# Patient Record
Sex: Female | Born: 1935 | Hispanic: No | Marital: Married
Health system: Southern US, Community
[De-identification: ages and names within clinical notes are randomized; demographics above are authoritative.]

## PROBLEM LIST (undated history)

## (undated) DIAGNOSIS — I10 Essential (primary) hypertension: Secondary | ICD-10-CM

## (undated) DIAGNOSIS — H9190 Unspecified hearing loss, unspecified ear: Secondary | ICD-10-CM

## (undated) DIAGNOSIS — R600 Localized edema: Secondary | ICD-10-CM

## (undated) DIAGNOSIS — E039 Hypothyroidism, unspecified: Secondary | ICD-10-CM

## (undated) DIAGNOSIS — Z950 Presence of cardiac pacemaker: Secondary | ICD-10-CM

## (undated) DIAGNOSIS — I482 Chronic atrial fibrillation, unspecified: Secondary | ICD-10-CM

## (undated) DIAGNOSIS — I442 Atrioventricular block, complete: Secondary | ICD-10-CM

## (undated) DIAGNOSIS — E785 Hyperlipidemia, unspecified: Secondary | ICD-10-CM

## (undated) DIAGNOSIS — N189 Chronic kidney disease, unspecified: Secondary | ICD-10-CM

## (undated) DIAGNOSIS — I739 Peripheral vascular disease, unspecified: Secondary | ICD-10-CM

## (undated) DIAGNOSIS — G4733 Obstructive sleep apnea (adult) (pediatric): Secondary | ICD-10-CM

## (undated) HISTORY — PX: THYROIDECTOMY: SHX17

---

## 2004-08-24 ENCOUNTER — Ambulatory Visit: Payer: Self-pay

## 2004-09-30 ENCOUNTER — Ambulatory Visit: Payer: Self-pay | Admitting: Internal Medicine

## 2004-11-09 ENCOUNTER — Ambulatory Visit: Payer: Self-pay | Admitting: Internal Medicine

## 2004-12-28 ENCOUNTER — Emergency Department: Payer: Self-pay | Admitting: Internal Medicine

## 2004-12-28 ENCOUNTER — Other Ambulatory Visit: Payer: Self-pay

## 2005-04-24 ENCOUNTER — Emergency Department: Payer: Self-pay | Admitting: Emergency Medicine

## 2005-04-28 ENCOUNTER — Other Ambulatory Visit: Payer: Self-pay

## 2005-04-28 ENCOUNTER — Inpatient Hospital Stay: Payer: Self-pay | Admitting: Internal Medicine

## 2005-05-06 ENCOUNTER — Encounter: Payer: Self-pay | Admitting: Internal Medicine

## 2005-05-20 ENCOUNTER — Encounter: Payer: Self-pay | Admitting: Internal Medicine

## 2005-05-25 ENCOUNTER — Ambulatory Visit: Payer: Self-pay

## 2005-06-20 ENCOUNTER — Encounter: Payer: Self-pay | Admitting: Internal Medicine

## 2005-07-20 ENCOUNTER — Encounter: Payer: Self-pay | Admitting: Internal Medicine

## 2005-08-20 ENCOUNTER — Encounter: Payer: Self-pay | Admitting: Internal Medicine

## 2005-09-19 ENCOUNTER — Encounter: Payer: Self-pay | Admitting: Internal Medicine

## 2005-10-20 ENCOUNTER — Encounter: Payer: Self-pay | Admitting: Internal Medicine

## 2005-11-20 ENCOUNTER — Encounter: Payer: Self-pay | Admitting: Internal Medicine

## 2005-12-20 ENCOUNTER — Encounter: Payer: Self-pay | Admitting: Internal Medicine

## 2006-01-20 ENCOUNTER — Encounter: Payer: Self-pay | Admitting: Internal Medicine

## 2006-05-02 ENCOUNTER — Emergency Department: Payer: Self-pay | Admitting: Internal Medicine

## 2006-06-22 ENCOUNTER — Emergency Department: Payer: Self-pay | Admitting: Emergency Medicine

## 2006-06-22 ENCOUNTER — Other Ambulatory Visit: Payer: Self-pay

## 2007-05-03 ENCOUNTER — Ambulatory Visit: Payer: Self-pay | Admitting: Internal Medicine

## 2007-09-26 ENCOUNTER — Ambulatory Visit: Payer: Self-pay | Admitting: Gastroenterology

## 2007-10-06 ENCOUNTER — Ambulatory Visit: Payer: Self-pay | Admitting: Unknown Physician Specialty

## 2007-11-01 ENCOUNTER — Ambulatory Visit: Payer: Self-pay | Admitting: Unknown Physician Specialty

## 2008-07-12 ENCOUNTER — Inpatient Hospital Stay: Payer: Self-pay | Admitting: Internal Medicine

## 2008-10-30 ENCOUNTER — Ambulatory Visit: Payer: Self-pay | Admitting: Internal Medicine

## 2012-12-08 ENCOUNTER — Ambulatory Visit: Payer: Self-pay | Admitting: Gastroenterology

## 2012-12-25 ENCOUNTER — Ambulatory Visit: Payer: Self-pay | Admitting: Gastroenterology

## 2013-04-26 ENCOUNTER — Ambulatory Visit: Payer: Self-pay | Admitting: Cardiology

## 2013-04-26 LAB — BASIC METABOLIC PANEL
ANION GAP: 1 — AB (ref 7–16)
BUN: 14 mg/dL (ref 7–18)
CALCIUM: 9.2 mg/dL (ref 8.5–10.1)
Chloride: 102 mmol/L (ref 98–107)
Co2: 32 mmol/L (ref 21–32)
Creatinine: 1.4 mg/dL — ABNORMAL HIGH (ref 0.60–1.30)
GFR CALC AF AMER: 37 — AB
GFR CALC NON AF AMER: 32 — AB
Glucose: 107 mg/dL — ABNORMAL HIGH (ref 65–99)
Osmolality: 271 (ref 275–301)
Potassium: 4.4 mmol/L (ref 3.5–5.1)
Sodium: 135 mmol/L — ABNORMAL LOW (ref 136–145)

## 2013-04-26 LAB — CBC WITH DIFFERENTIAL/PLATELET
BASOS PCT: 0.2 %
Basophil #: 0 10*3/uL (ref 0.0–0.1)
Eosinophil #: 0 10*3/uL (ref 0.0–0.7)
Eosinophil %: 0.4 %
HCT: 38.7 % (ref 35.0–47.0)
HGB: 13.2 g/dL (ref 12.0–16.0)
LYMPHS ABS: 1.3 10*3/uL (ref 1.0–3.6)
Lymphocyte %: 15.4 %
MCH: 31.5 pg (ref 26.0–34.0)
MCHC: 34.2 g/dL (ref 32.0–36.0)
MCV: 92 fL (ref 80–100)
Monocyte #: 0.6 x10 3/mm (ref 0.2–0.9)
Monocyte %: 7.4 %
Neutrophil #: 6.3 10*3/uL (ref 1.4–6.5)
Neutrophil %: 76.6 %
Platelet: 210 10*3/uL (ref 150–440)
RBC: 4.2 10*6/uL (ref 3.80–5.20)
RDW: 14.4 % (ref 11.5–14.5)
WBC: 8.2 10*3/uL (ref 3.6–11.0)

## 2013-04-26 LAB — PROTIME-INR
INR: 1
Prothrombin Time: 13.4 secs (ref 11.5–14.7)

## 2013-04-26 LAB — APTT: ACTIVATED PTT: 25.9 s (ref 23.6–35.9)

## 2013-05-02 ENCOUNTER — Ambulatory Visit: Payer: Self-pay | Admitting: Cardiology

## 2014-04-20 DIAGNOSIS — J449 Chronic obstructive pulmonary disease, unspecified: Secondary | ICD-10-CM | POA: Diagnosis not present

## 2014-04-25 DIAGNOSIS — G4733 Obstructive sleep apnea (adult) (pediatric): Secondary | ICD-10-CM | POA: Diagnosis not present

## 2014-04-25 DIAGNOSIS — I4891 Unspecified atrial fibrillation: Secondary | ICD-10-CM | POA: Diagnosis not present

## 2014-04-25 DIAGNOSIS — R600 Localized edema: Secondary | ICD-10-CM | POA: Diagnosis not present

## 2014-04-25 DIAGNOSIS — I442 Atrioventricular block, complete: Secondary | ICD-10-CM | POA: Diagnosis not present

## 2014-04-25 DIAGNOSIS — I482 Chronic atrial fibrillation: Secondary | ICD-10-CM | POA: Diagnosis not present

## 2014-05-20 DIAGNOSIS — J449 Chronic obstructive pulmonary disease, unspecified: Secondary | ICD-10-CM | POA: Diagnosis not present

## 2014-06-19 DIAGNOSIS — J449 Chronic obstructive pulmonary disease, unspecified: Secondary | ICD-10-CM | POA: Diagnosis not present

## 2014-07-13 NOTE — Op Note (Signed)
PATIENT NAME:  Victoria Colon, Victoria Colon MR#:  [id number] DATE OF BIRTH:  [birth year]  DATE OF PROCEDURE:  05/02/2013  PRIMARY CARE PHYSICIAN: Dr. Marcello FennelHande.  PREPROCEDURAL DIAGNOSES:  1. Complete heart block pacemaker. 2. Pacemaker and elective replacement indication.   PROCEDURE: Single-chamber pacemaker generator change out.   POSTPROCEDURE DIAGNOSIS: Ventricular pacing.   INDICATION: The patient is a 81 year old female who is status post dual chamber pacemaker for complete heart block. Recent pacemaker interrogation has shown the pacemaker was at elective replacement indication. The procedure, risks, benefits, and alternatives of pacemaker generator change out were explained to the patient and informed written consent was obtained.   DESCRIPTION OF PROCEDURE: She was brought to the Operating Room in a fasting state. The right pectoral region was prepped and draped in the usual sterile manner. Anesthesia was obtained with 1% Xylocaine locally. A 6 cm incision was performed over the old pacemaker generator site. The old pacemaker generator was retrieved by electrocautery and blunt dissection. The old pacemaker generator was disconnected from the leads. The atrial lead was capped and sutured. The ventricular lead was connected to a new single-chamber rate responsive pacemaker generator (Medtronic Adapta [id number]). The pacemaker pocket was irrigated with gentamicin solution. The new pacemaker generator was positioned in the pocket. The pocket was closed with 2 - 0 and 4-0 Vicryl, respectively. Steri-Strips and pressure dressing were applied    ____________________________ Marcina MillardAlexander Lockie Bothun, MD ap:sg D: 05/02/2013 13:15:00 ET T: 05/02/2013 13:23:50 ET JOB#: [id number]  cc: Marcina MillardAlexander Shaune Malacara, MD, <Dictator> Marcina MillardALEXANDER Cortland Crehan MD ELECTRONICALLY SIGNED 05/25/2013 13:14

## 2014-07-20 DIAGNOSIS — J449 Chronic obstructive pulmonary disease, unspecified: Secondary | ICD-10-CM | POA: Diagnosis not present

## 2014-07-23 DIAGNOSIS — I10 Essential (primary) hypertension: Secondary | ICD-10-CM | POA: Diagnosis not present

## 2014-07-23 DIAGNOSIS — I739 Peripheral vascular disease, unspecified: Secondary | ICD-10-CM | POA: Diagnosis not present

## 2014-07-23 DIAGNOSIS — I482 Chronic atrial fibrillation: Secondary | ICD-10-CM | POA: Diagnosis not present

## 2014-07-23 DIAGNOSIS — I442 Atrioventricular block, complete: Secondary | ICD-10-CM | POA: Diagnosis not present

## 2014-08-06 DIAGNOSIS — H6121 Impacted cerumen, right ear: Secondary | ICD-10-CM | POA: Diagnosis not present

## 2014-08-06 DIAGNOSIS — H903 Sensorineural hearing loss, bilateral: Secondary | ICD-10-CM | POA: Diagnosis not present

## 2014-08-06 DIAGNOSIS — H6123 Impacted cerumen, bilateral: Secondary | ICD-10-CM | POA: Diagnosis not present

## 2014-08-19 DIAGNOSIS — J449 Chronic obstructive pulmonary disease, unspecified: Secondary | ICD-10-CM | POA: Diagnosis not present

## 2014-09-19 DIAGNOSIS — J449 Chronic obstructive pulmonary disease, unspecified: Secondary | ICD-10-CM | POA: Diagnosis not present

## 2014-10-19 DIAGNOSIS — J449 Chronic obstructive pulmonary disease, unspecified: Secondary | ICD-10-CM | POA: Diagnosis not present

## 2014-10-22 DIAGNOSIS — I10 Essential (primary) hypertension: Secondary | ICD-10-CM | POA: Diagnosis not present

## 2014-10-22 DIAGNOSIS — I482 Chronic atrial fibrillation: Secondary | ICD-10-CM | POA: Diagnosis not present

## 2014-10-22 DIAGNOSIS — R600 Localized edema: Secondary | ICD-10-CM | POA: Diagnosis not present

## 2014-10-22 DIAGNOSIS — I442 Atrioventricular block, complete: Secondary | ICD-10-CM | POA: Diagnosis not present

## 2014-11-19 DIAGNOSIS — J449 Chronic obstructive pulmonary disease, unspecified: Secondary | ICD-10-CM | POA: Diagnosis not present

## 2014-11-28 DIAGNOSIS — I482 Chronic atrial fibrillation: Secondary | ICD-10-CM | POA: Diagnosis not present

## 2014-11-28 DIAGNOSIS — Z950 Presence of cardiac pacemaker: Secondary | ICD-10-CM | POA: Diagnosis not present

## 2014-11-28 DIAGNOSIS — I10 Essential (primary) hypertension: Secondary | ICD-10-CM | POA: Diagnosis not present

## 2014-11-28 DIAGNOSIS — G4733 Obstructive sleep apnea (adult) (pediatric): Secondary | ICD-10-CM | POA: Diagnosis not present

## 2014-12-20 DIAGNOSIS — J449 Chronic obstructive pulmonary disease, unspecified: Secondary | ICD-10-CM | POA: Diagnosis not present

## 2015-01-19 DIAGNOSIS — J449 Chronic obstructive pulmonary disease, unspecified: Secondary | ICD-10-CM | POA: Diagnosis not present

## 2015-02-19 DIAGNOSIS — J449 Chronic obstructive pulmonary disease, unspecified: Secondary | ICD-10-CM | POA: Diagnosis not present

## 2015-03-21 DIAGNOSIS — J449 Chronic obstructive pulmonary disease, unspecified: Secondary | ICD-10-CM | POA: Diagnosis not present

## 2015-04-21 DIAGNOSIS — J449 Chronic obstructive pulmonary disease, unspecified: Secondary | ICD-10-CM | POA: Diagnosis not present

## 2015-04-24 DIAGNOSIS — I442 Atrioventricular block, complete: Secondary | ICD-10-CM | POA: Diagnosis not present

## 2015-04-24 DIAGNOSIS — I739 Peripheral vascular disease, unspecified: Secondary | ICD-10-CM | POA: Diagnosis not present

## 2015-04-24 DIAGNOSIS — I482 Chronic atrial fibrillation: Secondary | ICD-10-CM | POA: Diagnosis not present

## 2015-04-24 DIAGNOSIS — E782 Mixed hyperlipidemia: Secondary | ICD-10-CM | POA: Diagnosis not present

## 2015-05-20 DIAGNOSIS — J449 Chronic obstructive pulmonary disease, unspecified: Secondary | ICD-10-CM | POA: Diagnosis not present

## 2015-05-22 DIAGNOSIS — Z950 Presence of cardiac pacemaker: Secondary | ICD-10-CM | POA: Diagnosis not present

## 2015-05-22 DIAGNOSIS — I482 Chronic atrial fibrillation: Secondary | ICD-10-CM | POA: Diagnosis not present

## 2015-05-22 DIAGNOSIS — H9193 Unspecified hearing loss, bilateral: Secondary | ICD-10-CM | POA: Diagnosis not present

## 2015-05-22 DIAGNOSIS — G4733 Obstructive sleep apnea (adult) (pediatric): Secondary | ICD-10-CM | POA: Diagnosis not present

## 2015-05-22 DIAGNOSIS — I10 Essential (primary) hypertension: Secondary | ICD-10-CM | POA: Diagnosis not present

## 2015-05-30 DIAGNOSIS — D638 Anemia in other chronic diseases classified elsewhere: Secondary | ICD-10-CM | POA: Diagnosis not present

## 2015-05-30 DIAGNOSIS — N183 Chronic kidney disease, stage 3 (moderate): Secondary | ICD-10-CM | POA: Diagnosis not present

## 2015-05-30 DIAGNOSIS — E039 Hypothyroidism, unspecified: Secondary | ICD-10-CM | POA: Diagnosis not present

## 2015-05-30 DIAGNOSIS — E782 Mixed hyperlipidemia: Secondary | ICD-10-CM | POA: Diagnosis not present

## 2015-05-30 DIAGNOSIS — I10 Essential (primary) hypertension: Secondary | ICD-10-CM | POA: Diagnosis not present

## 2015-05-30 DIAGNOSIS — Z0000 Encounter for general adult medical examination without abnormal findings: Secondary | ICD-10-CM | POA: Diagnosis not present

## 2015-05-30 DIAGNOSIS — I482 Chronic atrial fibrillation: Secondary | ICD-10-CM | POA: Diagnosis not present

## 2015-06-19 DIAGNOSIS — J449 Chronic obstructive pulmonary disease, unspecified: Secondary | ICD-10-CM | POA: Diagnosis not present

## 2015-07-20 DIAGNOSIS — J449 Chronic obstructive pulmonary disease, unspecified: Secondary | ICD-10-CM | POA: Diagnosis not present

## 2015-08-19 DIAGNOSIS — J449 Chronic obstructive pulmonary disease, unspecified: Secondary | ICD-10-CM | POA: Diagnosis not present

## 2015-09-19 DIAGNOSIS — J449 Chronic obstructive pulmonary disease, unspecified: Secondary | ICD-10-CM | POA: Diagnosis not present

## 2015-10-19 DIAGNOSIS — J449 Chronic obstructive pulmonary disease, unspecified: Secondary | ICD-10-CM | POA: Diagnosis not present

## 2015-11-13 DIAGNOSIS — R600 Localized edema: Secondary | ICD-10-CM | POA: Diagnosis not present

## 2015-11-13 DIAGNOSIS — Z950 Presence of cardiac pacemaker: Secondary | ICD-10-CM | POA: Diagnosis not present

## 2015-11-13 DIAGNOSIS — I10 Essential (primary) hypertension: Secondary | ICD-10-CM | POA: Diagnosis not present

## 2015-11-13 DIAGNOSIS — G4733 Obstructive sleep apnea (adult) (pediatric): Secondary | ICD-10-CM | POA: Diagnosis not present

## 2015-11-13 DIAGNOSIS — I482 Chronic atrial fibrillation: Secondary | ICD-10-CM | POA: Diagnosis not present

## 2015-11-19 DIAGNOSIS — J449 Chronic obstructive pulmonary disease, unspecified: Secondary | ICD-10-CM | POA: Diagnosis not present

## 2015-12-20 DIAGNOSIS — J449 Chronic obstructive pulmonary disease, unspecified: Secondary | ICD-10-CM | POA: Diagnosis not present

## 2015-12-25 DIAGNOSIS — L03031 Cellulitis of right toe: Secondary | ICD-10-CM | POA: Diagnosis not present

## 2015-12-25 DIAGNOSIS — M79674 Pain in right toe(s): Secondary | ICD-10-CM | POA: Diagnosis not present

## 2016-01-01 DIAGNOSIS — Z0000 Encounter for general adult medical examination without abnormal findings: Secondary | ICD-10-CM | POA: Diagnosis not present

## 2016-01-01 DIAGNOSIS — R600 Localized edema: Secondary | ICD-10-CM | POA: Diagnosis not present

## 2016-01-01 DIAGNOSIS — E039 Hypothyroidism, unspecified: Secondary | ICD-10-CM | POA: Diagnosis not present

## 2016-01-01 DIAGNOSIS — E782 Mixed hyperlipidemia: Secondary | ICD-10-CM | POA: Diagnosis not present

## 2016-01-01 DIAGNOSIS — I482 Chronic atrial fibrillation: Secondary | ICD-10-CM | POA: Diagnosis not present

## 2016-01-01 DIAGNOSIS — G4733 Obstructive sleep apnea (adult) (pediatric): Secondary | ICD-10-CM | POA: Diagnosis not present

## 2016-01-01 DIAGNOSIS — I442 Atrioventricular block, complete: Secondary | ICD-10-CM | POA: Diagnosis not present

## 2016-01-01 DIAGNOSIS — I10 Essential (primary) hypertension: Secondary | ICD-10-CM | POA: Diagnosis not present

## 2016-01-19 DIAGNOSIS — J449 Chronic obstructive pulmonary disease, unspecified: Secondary | ICD-10-CM | POA: Diagnosis not present

## 2016-02-19 DIAGNOSIS — J449 Chronic obstructive pulmonary disease, unspecified: Secondary | ICD-10-CM | POA: Diagnosis not present

## 2016-03-20 DIAGNOSIS — J449 Chronic obstructive pulmonary disease, unspecified: Secondary | ICD-10-CM | POA: Diagnosis not present

## 2016-04-20 DIAGNOSIS — J449 Chronic obstructive pulmonary disease, unspecified: Secondary | ICD-10-CM | POA: Diagnosis not present

## 2016-05-19 DIAGNOSIS — J449 Chronic obstructive pulmonary disease, unspecified: Secondary | ICD-10-CM | POA: Diagnosis not present

## 2016-06-01 DIAGNOSIS — I482 Chronic atrial fibrillation: Secondary | ICD-10-CM | POA: Diagnosis not present

## 2016-06-01 DIAGNOSIS — E782 Mixed hyperlipidemia: Secondary | ICD-10-CM | POA: Diagnosis not present

## 2016-06-01 DIAGNOSIS — I739 Peripheral vascular disease, unspecified: Secondary | ICD-10-CM | POA: Diagnosis not present

## 2016-06-01 DIAGNOSIS — Z950 Presence of cardiac pacemaker: Secondary | ICD-10-CM | POA: Diagnosis not present

## 2016-06-18 DIAGNOSIS — J449 Chronic obstructive pulmonary disease, unspecified: Secondary | ICD-10-CM | POA: Diagnosis not present

## 2016-06-29 DIAGNOSIS — R8290 Unspecified abnormal findings in urine: Secondary | ICD-10-CM | POA: Diagnosis not present

## 2016-06-29 DIAGNOSIS — E785 Hyperlipidemia, unspecified: Secondary | ICD-10-CM | POA: Diagnosis not present

## 2016-06-29 DIAGNOSIS — Z0000 Encounter for general adult medical examination without abnormal findings: Secondary | ICD-10-CM | POA: Diagnosis not present

## 2016-07-05 DIAGNOSIS — D649 Anemia, unspecified: Secondary | ICD-10-CM | POA: Diagnosis not present

## 2016-07-05 DIAGNOSIS — E039 Hypothyroidism, unspecified: Secondary | ICD-10-CM | POA: Diagnosis not present

## 2016-07-05 DIAGNOSIS — I482 Chronic atrial fibrillation: Secondary | ICD-10-CM | POA: Diagnosis not present

## 2016-07-05 DIAGNOSIS — I442 Atrioventricular block, complete: Secondary | ICD-10-CM | POA: Diagnosis not present

## 2016-07-05 DIAGNOSIS — N390 Urinary tract infection, site not specified: Secondary | ICD-10-CM | POA: Diagnosis not present

## 2016-07-05 DIAGNOSIS — Z0000 Encounter for general adult medical examination without abnormal findings: Secondary | ICD-10-CM | POA: Diagnosis not present

## 2016-07-05 DIAGNOSIS — E782 Mixed hyperlipidemia: Secondary | ICD-10-CM | POA: Diagnosis not present

## 2016-07-05 DIAGNOSIS — N183 Chronic kidney disease, stage 3 (moderate): Secondary | ICD-10-CM | POA: Diagnosis not present

## 2016-07-05 DIAGNOSIS — G4733 Obstructive sleep apnea (adult) (pediatric): Secondary | ICD-10-CM | POA: Diagnosis not present

## 2016-07-19 DIAGNOSIS — J449 Chronic obstructive pulmonary disease, unspecified: Secondary | ICD-10-CM | POA: Diagnosis not present

## 2016-08-18 DIAGNOSIS — J449 Chronic obstructive pulmonary disease, unspecified: Secondary | ICD-10-CM | POA: Diagnosis not present

## 2016-09-18 DIAGNOSIS — J449 Chronic obstructive pulmonary disease, unspecified: Secondary | ICD-10-CM | POA: Diagnosis not present

## 2016-10-18 DIAGNOSIS — J449 Chronic obstructive pulmonary disease, unspecified: Secondary | ICD-10-CM | POA: Diagnosis not present

## 2016-11-18 DIAGNOSIS — J449 Chronic obstructive pulmonary disease, unspecified: Secondary | ICD-10-CM | POA: Diagnosis not present

## 2016-12-02 DIAGNOSIS — I739 Peripheral vascular disease, unspecified: Secondary | ICD-10-CM | POA: Diagnosis not present

## 2016-12-02 DIAGNOSIS — I442 Atrioventricular block, complete: Secondary | ICD-10-CM | POA: Diagnosis not present

## 2016-12-02 DIAGNOSIS — I10 Essential (primary) hypertension: Secondary | ICD-10-CM | POA: Diagnosis not present

## 2016-12-02 DIAGNOSIS — R600 Localized edema: Secondary | ICD-10-CM | POA: Diagnosis not present

## 2016-12-02 DIAGNOSIS — N183 Chronic kidney disease, stage 3 (moderate): Secondary | ICD-10-CM | POA: Diagnosis not present

## 2016-12-02 DIAGNOSIS — E782 Mixed hyperlipidemia: Secondary | ICD-10-CM | POA: Diagnosis not present

## 2016-12-02 DIAGNOSIS — Z950 Presence of cardiac pacemaker: Secondary | ICD-10-CM | POA: Diagnosis not present

## 2016-12-02 DIAGNOSIS — G4733 Obstructive sleep apnea (adult) (pediatric): Secondary | ICD-10-CM | POA: Diagnosis not present

## 2016-12-02 DIAGNOSIS — I482 Chronic atrial fibrillation: Secondary | ICD-10-CM | POA: Diagnosis not present

## 2016-12-19 DIAGNOSIS — J449 Chronic obstructive pulmonary disease, unspecified: Secondary | ICD-10-CM | POA: Diagnosis not present

## 2016-12-27 DIAGNOSIS — R8290 Unspecified abnormal findings in urine: Secondary | ICD-10-CM | POA: Diagnosis not present

## 2016-12-27 DIAGNOSIS — Z0000 Encounter for general adult medical examination without abnormal findings: Secondary | ICD-10-CM | POA: Diagnosis not present

## 2016-12-27 DIAGNOSIS — N390 Urinary tract infection, site not specified: Secondary | ICD-10-CM | POA: Diagnosis not present

## 2016-12-27 DIAGNOSIS — D649 Anemia, unspecified: Secondary | ICD-10-CM | POA: Diagnosis not present

## 2016-12-27 DIAGNOSIS — E039 Hypothyroidism, unspecified: Secondary | ICD-10-CM | POA: Diagnosis not present

## 2016-12-27 DIAGNOSIS — E782 Mixed hyperlipidemia: Secondary | ICD-10-CM | POA: Diagnosis not present

## 2016-12-27 DIAGNOSIS — I482 Chronic atrial fibrillation: Secondary | ICD-10-CM | POA: Diagnosis not present

## 2016-12-27 DIAGNOSIS — I442 Atrioventricular block, complete: Secondary | ICD-10-CM | POA: Diagnosis not present

## 2016-12-27 DIAGNOSIS — N183 Chronic kidney disease, stage 3 (moderate): Secondary | ICD-10-CM | POA: Diagnosis not present

## 2016-12-27 DIAGNOSIS — G4733 Obstructive sleep apnea (adult) (pediatric): Secondary | ICD-10-CM | POA: Diagnosis not present

## 2017-01-03 DIAGNOSIS — E039 Hypothyroidism, unspecified: Secondary | ICD-10-CM | POA: Diagnosis not present

## 2017-01-03 DIAGNOSIS — Z950 Presence of cardiac pacemaker: Secondary | ICD-10-CM | POA: Diagnosis not present

## 2017-01-03 DIAGNOSIS — I10 Essential (primary) hypertension: Secondary | ICD-10-CM | POA: Diagnosis not present

## 2017-01-03 DIAGNOSIS — I442 Atrioventricular block, complete: Secondary | ICD-10-CM | POA: Diagnosis not present

## 2017-01-03 DIAGNOSIS — E875 Hyperkalemia: Secondary | ICD-10-CM | POA: Diagnosis not present

## 2017-01-03 DIAGNOSIS — R600 Localized edema: Secondary | ICD-10-CM | POA: Diagnosis not present

## 2017-01-03 DIAGNOSIS — E782 Mixed hyperlipidemia: Secondary | ICD-10-CM | POA: Diagnosis not present

## 2017-01-03 DIAGNOSIS — I482 Chronic atrial fibrillation: Secondary | ICD-10-CM | POA: Diagnosis not present

## 2017-01-03 DIAGNOSIS — N183 Chronic kidney disease, stage 3 (moderate): Secondary | ICD-10-CM | POA: Diagnosis not present

## 2017-01-18 DIAGNOSIS — J449 Chronic obstructive pulmonary disease, unspecified: Secondary | ICD-10-CM | POA: Diagnosis not present

## 2017-02-18 DIAGNOSIS — J449 Chronic obstructive pulmonary disease, unspecified: Secondary | ICD-10-CM | POA: Diagnosis not present

## 2017-02-22 DIAGNOSIS — R05 Cough: Secondary | ICD-10-CM | POA: Diagnosis not present

## 2017-02-22 DIAGNOSIS — R0602 Shortness of breath: Secondary | ICD-10-CM | POA: Diagnosis not present

## 2017-02-22 DIAGNOSIS — R601 Generalized edema: Secondary | ICD-10-CM | POA: Diagnosis not present

## 2017-02-22 DIAGNOSIS — N183 Chronic kidney disease, stage 3 (moderate): Secondary | ICD-10-CM | POA: Diagnosis not present

## 2017-02-25 DIAGNOSIS — I10 Essential (primary) hypertension: Secondary | ICD-10-CM | POA: Diagnosis not present

## 2017-02-25 DIAGNOSIS — R0602 Shortness of breath: Secondary | ICD-10-CM | POA: Diagnosis not present

## 2017-02-25 DIAGNOSIS — R600 Localized edema: Secondary | ICD-10-CM | POA: Diagnosis not present

## 2017-03-03 DIAGNOSIS — R601 Generalized edema: Secondary | ICD-10-CM | POA: Diagnosis not present

## 2017-03-03 DIAGNOSIS — N183 Chronic kidney disease, stage 3 (moderate): Secondary | ICD-10-CM | POA: Diagnosis not present

## 2017-03-09 DIAGNOSIS — R0602 Shortness of breath: Secondary | ICD-10-CM | POA: Diagnosis not present

## 2017-03-09 DIAGNOSIS — R600 Localized edema: Secondary | ICD-10-CM | POA: Diagnosis not present

## 2017-03-18 DIAGNOSIS — G4733 Obstructive sleep apnea (adult) (pediatric): Secondary | ICD-10-CM | POA: Diagnosis not present

## 2017-03-18 DIAGNOSIS — E782 Mixed hyperlipidemia: Secondary | ICD-10-CM | POA: Diagnosis not present

## 2017-03-18 DIAGNOSIS — R600 Localized edema: Secondary | ICD-10-CM | POA: Diagnosis not present

## 2017-03-18 DIAGNOSIS — I482 Chronic atrial fibrillation: Secondary | ICD-10-CM | POA: Diagnosis not present

## 2017-03-18 DIAGNOSIS — Z950 Presence of cardiac pacemaker: Secondary | ICD-10-CM | POA: Diagnosis not present

## 2017-03-18 DIAGNOSIS — N183 Chronic kidney disease, stage 3 (moderate): Secondary | ICD-10-CM | POA: Diagnosis not present

## 2017-03-18 DIAGNOSIS — E039 Hypothyroidism, unspecified: Secondary | ICD-10-CM | POA: Diagnosis not present

## 2017-03-18 DIAGNOSIS — I10 Essential (primary) hypertension: Secondary | ICD-10-CM | POA: Diagnosis not present

## 2017-03-20 DIAGNOSIS — J449 Chronic obstructive pulmonary disease, unspecified: Secondary | ICD-10-CM | POA: Diagnosis not present

## 2017-04-12 DIAGNOSIS — N183 Chronic kidney disease, stage 3 (moderate): Secondary | ICD-10-CM | POA: Diagnosis not present

## 2017-04-12 DIAGNOSIS — I482 Chronic atrial fibrillation: Secondary | ICD-10-CM | POA: Diagnosis not present

## 2017-04-12 DIAGNOSIS — R600 Localized edema: Secondary | ICD-10-CM | POA: Diagnosis not present

## 2017-04-12 DIAGNOSIS — G4733 Obstructive sleep apnea (adult) (pediatric): Secondary | ICD-10-CM | POA: Diagnosis not present

## 2017-04-12 DIAGNOSIS — E039 Hypothyroidism, unspecified: Secondary | ICD-10-CM | POA: Diagnosis not present

## 2017-04-12 DIAGNOSIS — R05 Cough: Secondary | ICD-10-CM | POA: Diagnosis not present

## 2017-04-20 DIAGNOSIS — J449 Chronic obstructive pulmonary disease, unspecified: Secondary | ICD-10-CM | POA: Diagnosis not present

## 2017-05-19 DIAGNOSIS — J449 Chronic obstructive pulmonary disease, unspecified: Secondary | ICD-10-CM | POA: Diagnosis not present

## 2017-06-02 DIAGNOSIS — R600 Localized edema: Secondary | ICD-10-CM | POA: Diagnosis not present

## 2017-06-02 DIAGNOSIS — I482 Chronic atrial fibrillation: Secondary | ICD-10-CM | POA: Diagnosis not present

## 2017-06-02 DIAGNOSIS — I442 Atrioventricular block, complete: Secondary | ICD-10-CM | POA: Diagnosis not present

## 2017-06-18 DIAGNOSIS — J449 Chronic obstructive pulmonary disease, unspecified: Secondary | ICD-10-CM | POA: Diagnosis not present

## 2017-06-20 DIAGNOSIS — N183 Chronic kidney disease, stage 3 (moderate): Secondary | ICD-10-CM | POA: Diagnosis not present

## 2017-06-20 DIAGNOSIS — E782 Mixed hyperlipidemia: Secondary | ICD-10-CM | POA: Diagnosis not present

## 2017-06-20 DIAGNOSIS — R5383 Other fatigue: Secondary | ICD-10-CM | POA: Diagnosis not present

## 2017-06-20 DIAGNOSIS — I442 Atrioventricular block, complete: Secondary | ICD-10-CM | POA: Diagnosis not present

## 2017-06-20 DIAGNOSIS — Z950 Presence of cardiac pacemaker: Secondary | ICD-10-CM | POA: Diagnosis not present

## 2017-06-20 DIAGNOSIS — I10 Essential (primary) hypertension: Secondary | ICD-10-CM | POA: Diagnosis not present

## 2017-06-20 DIAGNOSIS — I482 Chronic atrial fibrillation: Secondary | ICD-10-CM | POA: Diagnosis not present

## 2017-06-20 DIAGNOSIS — E039 Hypothyroidism, unspecified: Secondary | ICD-10-CM | POA: Diagnosis not present

## 2017-06-20 DIAGNOSIS — R600 Localized edema: Secondary | ICD-10-CM | POA: Diagnosis not present

## 2017-07-07 DIAGNOSIS — I482 Chronic atrial fibrillation: Secondary | ICD-10-CM | POA: Diagnosis not present

## 2017-07-07 DIAGNOSIS — E039 Hypothyroidism, unspecified: Secondary | ICD-10-CM | POA: Diagnosis not present

## 2017-07-07 DIAGNOSIS — I10 Essential (primary) hypertension: Secondary | ICD-10-CM | POA: Diagnosis not present

## 2017-07-07 DIAGNOSIS — Z0000 Encounter for general adult medical examination without abnormal findings: Secondary | ICD-10-CM | POA: Diagnosis not present

## 2017-07-07 DIAGNOSIS — I442 Atrioventricular block, complete: Secondary | ICD-10-CM | POA: Diagnosis not present

## 2017-07-07 DIAGNOSIS — G4733 Obstructive sleep apnea (adult) (pediatric): Secondary | ICD-10-CM | POA: Diagnosis not present

## 2017-07-07 DIAGNOSIS — N183 Chronic kidney disease, stage 3 (moderate): Secondary | ICD-10-CM | POA: Diagnosis not present

## 2017-07-07 DIAGNOSIS — E782 Mixed hyperlipidemia: Secondary | ICD-10-CM | POA: Diagnosis not present

## 2017-07-07 DIAGNOSIS — Z950 Presence of cardiac pacemaker: Secondary | ICD-10-CM | POA: Diagnosis not present

## 2017-07-08 ENCOUNTER — Encounter: Payer: Self-pay | Admitting: Emergency Medicine

## 2017-07-08 ENCOUNTER — Emergency Department: Payer: Medicare HMO

## 2017-07-08 ENCOUNTER — Inpatient Hospital Stay
Admission: EM | Admit: 2017-07-08 | Discharge: 2017-07-12 | DRG: 871 | Disposition: A | Discharge status: Home Health | Payer: Medicare HMO | Attending: Internal Medicine | Admitting: Internal Medicine

## 2017-07-08 ENCOUNTER — Other Ambulatory Visit: Payer: Self-pay

## 2017-07-08 DIAGNOSIS — I739 Peripheral vascular disease, unspecified: Secondary | ICD-10-CM | POA: Diagnosis present

## 2017-07-08 DIAGNOSIS — J9601 Acute respiratory failure with hypoxia: Secondary | ICD-10-CM

## 2017-07-08 DIAGNOSIS — M6281 Muscle weakness (generalized): Secondary | ICD-10-CM | POA: Diagnosis not present

## 2017-07-08 DIAGNOSIS — Z7401 Bed confinement status: Secondary | ICD-10-CM | POA: Diagnosis not present

## 2017-07-08 DIAGNOSIS — N179 Acute kidney failure, unspecified: Secondary | ICD-10-CM | POA: Diagnosis present

## 2017-07-08 DIAGNOSIS — R0600 Dyspnea, unspecified: Secondary | ICD-10-CM | POA: Diagnosis not present

## 2017-07-08 DIAGNOSIS — E039 Hypothyroidism, unspecified: Secondary | ICD-10-CM | POA: Diagnosis present

## 2017-07-08 DIAGNOSIS — N183 Chronic kidney disease, stage 3 unspecified: Secondary | ICD-10-CM | POA: Diagnosis present

## 2017-07-08 DIAGNOSIS — Z950 Presence of cardiac pacemaker: Secondary | ICD-10-CM | POA: Diagnosis not present

## 2017-07-08 DIAGNOSIS — J189 Pneumonia, unspecified organism: Secondary | ICD-10-CM | POA: Diagnosis present

## 2017-07-08 DIAGNOSIS — R6520 Severe sepsis without septic shock: Secondary | ICD-10-CM | POA: Diagnosis present

## 2017-07-08 DIAGNOSIS — I482 Chronic atrial fibrillation, unspecified: Secondary | ICD-10-CM | POA: Diagnosis present

## 2017-07-08 DIAGNOSIS — R0602 Shortness of breath: Secondary | ICD-10-CM

## 2017-07-08 DIAGNOSIS — E785 Hyperlipidemia, unspecified: Secondary | ICD-10-CM | POA: Diagnosis present

## 2017-07-08 DIAGNOSIS — A419 Sepsis, unspecified organism: Secondary | ICD-10-CM

## 2017-07-08 DIAGNOSIS — I130 Hypertensive heart and chronic kidney disease with heart failure and stage 1 through stage 4 chronic kidney disease, or unspecified chronic kidney disease: Secondary | ICD-10-CM | POA: Diagnosis present

## 2017-07-08 DIAGNOSIS — E875 Hyperkalemia: Secondary | ICD-10-CM | POA: Diagnosis present

## 2017-07-08 DIAGNOSIS — J9600 Acute respiratory failure, unspecified whether with hypoxia or hypercapnia: Secondary | ICD-10-CM | POA: Diagnosis present

## 2017-07-08 DIAGNOSIS — Z66 Do not resuscitate: Secondary | ICD-10-CM | POA: Diagnosis present

## 2017-07-08 DIAGNOSIS — G4733 Obstructive sleep apnea (adult) (pediatric): Secondary | ICD-10-CM | POA: Diagnosis present

## 2017-07-08 DIAGNOSIS — Z79890 Hormone replacement therapy (postmenopausal): Secondary | ICD-10-CM | POA: Diagnosis not present

## 2017-07-08 DIAGNOSIS — I509 Heart failure, unspecified: Secondary | ICD-10-CM | POA: Diagnosis present

## 2017-07-08 DIAGNOSIS — E890 Postprocedural hypothyroidism: Secondary | ICD-10-CM | POA: Diagnosis present

## 2017-07-08 DIAGNOSIS — I248 Other forms of acute ischemic heart disease: Secondary | ICD-10-CM | POA: Diagnosis present

## 2017-07-08 DIAGNOSIS — A021 Salmonella sepsis: Principal | ICD-10-CM | POA: Diagnosis present

## 2017-07-08 DIAGNOSIS — R7881 Bacteremia: Secondary | ICD-10-CM | POA: Diagnosis not present

## 2017-07-08 DIAGNOSIS — J449 Chronic obstructive pulmonary disease, unspecified: Secondary | ICD-10-CM | POA: Diagnosis not present

## 2017-07-08 DIAGNOSIS — J9811 Atelectasis: Secondary | ICD-10-CM | POA: Diagnosis not present

## 2017-07-08 DIAGNOSIS — I10 Essential (primary) hypertension: Secondary | ICD-10-CM | POA: Diagnosis present

## 2017-07-08 HISTORY — DX: Hyperlipidemia, unspecified: E78.5

## 2017-07-08 HISTORY — DX: Peripheral vascular disease, unspecified: I73.9

## 2017-07-08 HISTORY — DX: Localized edema: R60.0

## 2017-07-08 HISTORY — DX: Obstructive sleep apnea (adult) (pediatric): G47.33

## 2017-07-08 HISTORY — DX: Chronic atrial fibrillation, unspecified: I48.20

## 2017-07-08 HISTORY — DX: Unspecified hearing loss, unspecified ear: H91.90

## 2017-07-08 HISTORY — DX: Presence of cardiac pacemaker: Z95.0

## 2017-07-08 HISTORY — DX: Hypothyroidism, unspecified: E03.9

## 2017-07-08 HISTORY — DX: Atrioventricular block, complete: I44.2

## 2017-07-08 HISTORY — DX: Essential (primary) hypertension: I10

## 2017-07-08 HISTORY — DX: Chronic kidney disease, unspecified: N18.9

## 2017-07-08 LAB — COMPREHENSIVE METABOLIC PANEL
ALK PHOS: 172 U/L — AB (ref 38–126)
ALT: 45 U/L (ref 14–54)
AST: 61 U/L — AB (ref 15–41)
Albumin: 3.2 g/dL — ABNORMAL LOW (ref 3.5–5.0)
Anion gap: 11 (ref 5–15)
BILIRUBIN TOTAL: 1.5 mg/dL — AB (ref 0.3–1.2)
BUN: 46 mg/dL — AB (ref 6–20)
CALCIUM: 8.3 mg/dL — AB (ref 8.9–10.3)
CHLORIDE: 101 mmol/L (ref 101–111)
CO2: 25 mmol/L (ref 22–32)
CREATININE: 1.92 mg/dL — AB (ref 0.44–1.00)
GFR, EST AFRICAN AMERICAN: 24 mL/min — AB (ref >60)
GFR, EST NON AFRICAN AMERICAN: 20 mL/min — AB (ref >60)
Glucose, Bld: 186 mg/dL — ABNORMAL HIGH (ref 65–99)
Potassium: 4.4 mmol/L (ref 3.5–5.1)
Sodium: 137 mmol/L (ref 135–145)
TOTAL PROTEIN: 7.5 g/dL (ref 6.5–8.1)

## 2017-07-08 LAB — CBC WITH DIFFERENTIAL/PLATELET
BASOS ABS: 0.1 10*3/uL (ref 0–0.1)
BASOS PCT: 0 %
EOS ABS: 0 10*3/uL (ref 0–0.7)
EOS PCT: 0 %
HCT: 34.6 % — ABNORMAL LOW (ref 35.0–47.0)
Hemoglobin: 11.2 g/dL — ABNORMAL LOW (ref 12.0–16.0)
Lymphocytes Relative: 1 %
Lymphs Abs: 0.1 10*3/uL — ABNORMAL LOW (ref 1.0–3.6)
MCH: 28.8 pg (ref 26.0–34.0)
MCHC: 32.5 g/dL (ref 32.0–36.0)
MCV: 88.7 fL (ref 80.0–100.0)
MONO ABS: 0.2 10*3/uL (ref 0.2–0.9)
Monocytes Relative: 1 %
Neutro Abs: 15 10*3/uL — ABNORMAL HIGH (ref 1.4–6.5)
Neutrophils Relative %: 98 %
PLATELETS: 188 10*3/uL (ref 150–440)
RBC: 3.9 MIL/uL (ref 3.80–5.20)
RDW: 14.7 % — AB (ref 11.5–14.5)
WBC: 15.3 10*3/uL — AB (ref 3.6–11.0)

## 2017-07-08 LAB — TROPONIN I: TROPONIN I: 0.11 ng/mL — AB (ref <0.03)

## 2017-07-08 LAB — LACTIC ACID, PLASMA: LACTIC ACID, VENOUS: 2.4 mmol/L — AB (ref 0.5–1.9)

## 2017-07-08 MED ORDER — VANCOMYCIN HCL IN DEXTROSE 1-5 GM/200ML-% IV SOLN
1000.0000 mg | Freq: Once | INTRAVENOUS | Status: AC
  Administered 2017-07-08: 1000 mg via INTRAVENOUS
  Filled 2017-07-08: qty 200

## 2017-07-08 MED ORDER — VANCOMYCIN HCL IN DEXTROSE 1-5 GM/200ML-% IV SOLN
1000.0000 mg | INTRAVENOUS | Status: DC
  Filled 2017-07-08: qty 200

## 2017-07-08 MED ORDER — ACETAMINOPHEN 500 MG PO TABS
1000.0000 mg | ORAL_TABLET | Freq: Once | ORAL | Status: AC
  Administered 2017-07-08: 1000 mg via ORAL
  Filled 2017-07-08: qty 2

## 2017-07-08 MED ORDER — SODIUM CHLORIDE 0.9 % IV SOLN
2.0000 g | INTRAVENOUS | Status: DC
  Administered 2017-07-09 – 2017-07-11 (×3): 2 g via INTRAVENOUS
  Filled 2017-07-08 (×4): qty 2

## 2017-07-08 MED ORDER — ASPIRIN 81 MG PO CHEW
324.0000 mg | CHEWABLE_TABLET | Freq: Once | ORAL | Status: AC
Start: 2017-07-08 — End: 2017-07-09
  Administered 2017-07-09: 324 mg via ORAL
  Filled 2017-07-08: qty 4

## 2017-07-08 MED ORDER — CEFEPIME HCL 2 G IJ SOLR
2.0000 g | Freq: Once | INTRAMUSCULAR | Status: AC
  Administered 2017-07-08: 2 g via INTRAVENOUS
  Filled 2017-07-08: qty 2

## 2017-07-08 NOTE — ED Provider Notes (Signed)
[facility] Emergency Department Provider Note    First MD Initiated Contact with Patient 07/08/17 2216     (approximate)  I have reviewed the triage vital signs and the nursing notes.   HISTORY  Chief Complaint Respiratory Distress  Level V Caveat:  resp distress  HPI Victoria Colon is a 84 y.o. female presents from home with chief complaint of difficulty breathing that occurred roughly 2 hours prior to arrival.  Patient does have history of extensive CHF.  Patient was found to be hypoxic to 77% on room air.  Found to be febrile to 103.  Family told EMS that she did have a DNR but they wanted EMS to do everything and did not send that dnr.   Patient very hard of hearing does not provide any additional history.  Past Medical History:  Diagnosis Date  . Acquired hypothyroidism   . Benign essential hypertension   . Bilateral leg edema   . Cardiac pacemaker   . Chronic a-fib (HCC)   . CKD (chronic kidney disease)   . Complete heart block (HCC)   . HOH (hard of hearing)   . Hyperlipidemia   . OSA (obstructive sleep apnea)   . PVD (peripheral vascular disease) (HCC)    History reviewed. No pertinent family history. History reviewed. No pertinent surgical history. There are no active problems to display for this patient.     Prior to Admission medications   Medication Sig Start Date End Date Taking? Authorizing Provider  docusate sodium (COLACE) 100 MG capsule Take 100 mg by mouth 2 (two) times daily as needed for mild constipation.   Yes [provider]  ferrous sulfate 325 (65 FE) MG tablet Take 325 mg by mouth daily with breakfast.   Yes [provider]  levothyroxine (SYNTHROID, LEVOTHROID) 88 MCG tablet Take 88 mcg by mouth daily before breakfast.   Yes [provider]  losartan (COZAAR) 100 MG tablet Take 100 mg by mouth daily.   Yes [provider]  metolazone (ZAROXOLYN) 2.5 MG tablet Take 2.5 mg by mouth  once a week.   Yes [provider]  omeprazole (PRILOSEC) 20 MG capsule Take 20 mg by mouth daily.   Yes [provider]  pravastatin (PRAVACHOL) 20 MG tablet Take 20 mg by mouth every evening.   Yes [provider]  torsemide (DEMADEX) 10 MG tablet Take 10 mg by mouth daily.   Yes [provider]  vitamin B-12 (CYANOCOBALAMIN) 1000 MCG tablet Take 1,000 mcg by mouth daily.   Yes [provider]    Allergies Ciprofloxacin    Social History Social History   Tobacco Use  . Smoking status: Unknown If Ever Smoked  . Smokeless tobacco: Never Used  Substance Use Topics  . Alcohol use: Not Currently  . Drug use: Never    Review of Systems Patient denies headaches, rhinorrhea, blurry vision, numbness, shortness of breath, chest pain, edema, cough, abdominal pain, nausea, vomiting, diarrhea, dysuria, fevers, rashes or hallucinations unless otherwise stated above in HPI. ____________________________________________   PHYSICAL EXAM:  VITAL SIGNS: Vitals:   07/08/17 2320 07/08/17 2339  BP: 95/71 (!) 151/44  Pulse: 69 67  Resp: 18 16  Temp:    SpO2: 100% 99%    Constitutional: Alert critically ill appearing in moderate resp distress distress. Eyes: Conjunctivae are normal.  Head: Atraumatic. Nose: No congestion/rhinnorhea. Mouth/Throat: Mucous membranes are moist.   Neck: No stridor. Painless ROM.  Cardiovascular: Normal rate, regular rhythm.  Grossly normal heart sounds.  Good peripheral circulation. Respiratory: thachypnea with diminished bibasilar breath sounds Gastrointestinal: Soft and nontender. No distention. No abdominal bruits. No CVA tenderness. Genitourinary: deferred Musculoskeletal: No lower extremity tenderness, 2+ BLE edema.  No joint effusions. Neurologic:  Normal speech and language. No gross focal neurologic deficits are appreciated. No facial droop Skin:  Skin is warm, dry and intact. No rash  noted. Psychiatric:unable to assess ____________________________________________   LABS (all labs ordered are listed, but only abnormal results are displayed)  Results for orders placed or performed during the hospital encounter of 07/08/17 (from the past 24 hour(s))  Lactic acid, plasma     Status: Abnormal   Collection Time: 07/08/17 10:46 PM  Result Value Ref Range   Lactic Acid, Venous 2.4 (HH) 0.5 - 1.9 mmol/L  Comprehensive metabolic panel     Status: Abnormal   Collection Time: 07/08/17 10:46 PM  Result Value Ref Range   Sodium 137 135 - 145 mmol/L   Potassium 4.4 3.5 - 5.1 mmol/L   Chloride 101 101 - 111 mmol/L   CO2 25 22 - 32 mmol/L   Glucose, Bld 186 (H) 65 - 99 mg/dL   BUN 46 (H) 6 - 20 mg/dL   Creatinine, Ser 4.09 (H) 0.44 - 1.00 mg/dL   Calcium 8.3 (L) 8.9 - 10.3 mg/dL   Total Protein 7.5 6.5 - 8.1 g/dL   Albumin 3.2 (L) 3.5 - 5.0 g/dL   AST 61 (H) 15 - 41 U/L   ALT 45 14 - 54 U/L   Alkaline Phosphatase 172 (H) 38 - 126 U/L   Total Bilirubin 1.5 (H) 0.3 - 1.2 mg/dL   GFR calc non Af Amer 20 (L) >60 mL/min   GFR calc Af Amer 24 (L) >60 mL/min   Anion gap 11 5 - 15  Troponin I     Status: Abnormal   Collection Time: 07/08/17 10:46 PM  Result Value Ref Range   Troponin I 0.11 (HH) <0.03 ng/mL  CBC WITH DIFFERENTIAL     Status: Abnormal   Collection Time: 07/08/17 10:46 PM  Result Value Ref Range   WBC 15.3 (H) 3.6 - 11.0 K/uL   RBC 3.90 3.80 - 5.20 MIL/uL   Hemoglobin 11.2 (L) 12.0 - 16.0 g/dL   HCT 81.1 (L) 91.4 - 78.2 %   MCV 88.7 80.0 - 100.0 fL   MCH 28.8 26.0 - 34.0 pg   MCHC 32.5 32.0 - 36.0 g/dL   RDW 95.6 (H) 21.3 - 08.6 %   Platelets 188 150 - 440 K/uL   Neutrophils Relative % 98 %   Neutro Abs 15.0 (H) 1.4 - 6.5 K/uL   Lymphocytes Relative 1 %   Lymphs Abs 0.1 (L) 1.0 - 3.6 K/uL   Monocytes Relative 1 %   Monocytes Absolute 0.2 0.2 - 0.9 K/uL   Eosinophils Relative 0 %   Eosinophils Absolute 0.0 0 - 0.7 K/uL   Basophils Relative 0 %    Basophils Absolute 0.1 0 - 0.1 K/uL   ____________________________________________  EKG My review and personal interpretation at Time: 22:19   Indication: resp distress  Rate: 80  Rhythm: v-pace Axis: left Other: nonsepcific st changes, paced rhythm ____________________________________________  RADIOLOGY  I personally reviewed all radiographic images ordered to evaluate for the above acute complaints and reviewed radiology reports and findings.  These findings were personally discussed with the patient.  Please see medical record for radiology report.  ____________________________________________   PROCEDURES  Procedure(s)  performed:  .Critical Care Performed by: Willy Eddyobinson, Lanna Labella, MD Authorized by: Willy Eddyobinson, Mellissa Conley, MD   Critical care provider statement:    Critical care time (minutes):  30   Critical care time was exclusive of:  Separately billable procedures and treating other patients   Critical care was necessary to treat or prevent imminent or life-threatening deterioration of the following conditions:  Sepsis   Critical care was time spent personally by me on the following activities:  Development of treatment plan with patient or surrogate, discussions with consultants, evaluation of patient's response to treatment, examination of patient, obtaining history from patient or surrogate, ordering and performing treatments and interventions, ordering and review of laboratory studies, ordering and review of radiographic studies, pulse oximetry, re-evaluation of patient's condition and review of old charts      Critical Care performed: yes ____________________________________________   INITIAL IMPRESSION / ASSESSMENT AND PLAN / ED COURSE  Pertinent labs & imaging results that were available during my care of the patient were reviewed by me and considered in my medical decision making (see chart for details).  DDX: pna, chf, copd, aspiration, uti, bacteremia  Victoria  Particia Colon is a 84 y.o. who presents to the ED with acute respiratory distress and evidence of sepsis hyperthermic and high hypoxic respiratory failure as described above.  Patient placed on BiPAP.  Family later will come to bedside well continue resuscitate patient she did have improvement in symptoms while placed on BiPAP.  They stated they would not want her to have any escalation of care and agree that she is a DNR would not want to be intubated or have chest compressions performed.  I do agree with IV antibiotics and medications to him for the support of breathing but recognized that her condition is critical she has a very poor prognosis.  Will require admission for further medical management and symptomatic control.  Clinical Course as of Jul 08 2344  Fri Jul 08, 2017  2345 Will give aspirin.  Troponin 0.11.  This likely secondary to demand ischemia.  Lactate elevated 2.4.  Patient does appear more comfortable on BiPAP.  Blood pressure stable.  Will discuss with hospitalist.   [PR]    Clinical Course User Index [PR] Willy Eddyobinson, Yash Cacciola, MD     As part of my medical decision making, I reviewed the following data within the electronic MEDICAL RECORD NUMBER Nursing notes reviewed and incorporated, Labs reviewed, notes from prior ED visits .  ____________________________________________   FINAL CLINICAL IMPRESSION(S) / ED DIAGNOSES  Final diagnoses:  Acute respiratory failure with hypoxia (HCC)  Sepsis, due to unspecified organism [facility](HCC)      NEW MEDICATIONS STARTED DURING THIS VISIT:  New Prescriptions   No medications on file     Note:  This document was prepared using Dragon voice recognition software and may include unintentional dictation errors.    Willy Eddyobinson, Alonnie Bieker, MD 07/08/17 [phone]

## 2017-07-08 NOTE — ED Notes (Addendum)
Dr Roxan Hockeyobinson made aware at this time of pt's elevated Lactate level as reported by lab= Lactic 2.4 mmol/L

## 2017-07-08 NOTE — Progress Notes (Signed)
CODE SEPSIS - PHARMACY COMMUNICATION  **Broad Spectrum Antibiotics should be administered within 1 hour of Sepsis diagnosis**  Time Code Sepsis Called/Page Received: 4/19 0223  Antibiotics Ordered: 04/19 0223 vanc cefepime  Time of 1st antibiotic administration: 04/19 2310  Additional action taken by pharmacy:   If necessary, Name of Provider/Nurse Contacted:     Erich MontaneMcBane,Tinea Nobile S ,PharmD Clinical Pharmacist  07/08/2017  11:27 PM

## 2017-07-08 NOTE — ED Notes (Signed)
Dr Roxan Hockeyobinson made aware at this time of pt's elevated Troponin level as reported by lab= Troponin 0.11 ng/mL

## 2017-07-08 NOTE — ED Notes (Addendum)
Purple DNR wristband placed on pt at this time.

## 2017-07-08 NOTE — ED Triage Notes (Signed)
Pt to ED via EMS from home c/o breathing difficulty started approx 2hr PTA, bilateral pitting edema, hx CHF, axillary temperature 103, 77% RA and put on 4L [place] up to 98%.  EDP and respiratory therapist at bedside.  Pt placed on bipap.

## 2017-07-09 ENCOUNTER — Other Ambulatory Visit: Payer: Self-pay

## 2017-07-09 ENCOUNTER — Encounter: Payer: Self-pay | Admitting: Internal Medicine

## 2017-07-09 DIAGNOSIS — R6520 Severe sepsis without septic shock: Secondary | ICD-10-CM | POA: Diagnosis present

## 2017-07-09 DIAGNOSIS — I130 Hypertensive heart and chronic kidney disease with heart failure and stage 1 through stage 4 chronic kidney disease, or unspecified chronic kidney disease: Secondary | ICD-10-CM | POA: Diagnosis present

## 2017-07-09 DIAGNOSIS — G4733 Obstructive sleep apnea (adult) (pediatric): Secondary | ICD-10-CM | POA: Diagnosis present

## 2017-07-09 DIAGNOSIS — J189 Pneumonia, unspecified organism: Secondary | ICD-10-CM | POA: Diagnosis present

## 2017-07-09 DIAGNOSIS — N183 Chronic kidney disease, stage 3 (moderate): Secondary | ICD-10-CM | POA: Diagnosis present

## 2017-07-09 DIAGNOSIS — J9600 Acute respiratory failure, unspecified whether with hypoxia or hypercapnia: Secondary | ICD-10-CM | POA: Diagnosis present

## 2017-07-09 DIAGNOSIS — N179 Acute kidney failure, unspecified: Secondary | ICD-10-CM | POA: Diagnosis present

## 2017-07-09 DIAGNOSIS — I482 Chronic atrial fibrillation: Secondary | ICD-10-CM | POA: Diagnosis present

## 2017-07-09 DIAGNOSIS — I509 Heart failure, unspecified: Secondary | ICD-10-CM | POA: Diagnosis present

## 2017-07-09 DIAGNOSIS — R0602 Shortness of breath: Secondary | ICD-10-CM | POA: Diagnosis present

## 2017-07-09 DIAGNOSIS — R0600 Dyspnea, unspecified: Secondary | ICD-10-CM | POA: Diagnosis not present

## 2017-07-09 DIAGNOSIS — I739 Peripheral vascular disease, unspecified: Secondary | ICD-10-CM | POA: Diagnosis present

## 2017-07-09 DIAGNOSIS — A021 Salmonella sepsis: Secondary | ICD-10-CM | POA: Diagnosis present

## 2017-07-09 DIAGNOSIS — E890 Postprocedural hypothyroidism: Secondary | ICD-10-CM | POA: Diagnosis present

## 2017-07-09 DIAGNOSIS — Z950 Presence of cardiac pacemaker: Secondary | ICD-10-CM | POA: Diagnosis not present

## 2017-07-09 DIAGNOSIS — J9601 Acute respiratory failure with hypoxia: Secondary | ICD-10-CM | POA: Diagnosis present

## 2017-07-09 DIAGNOSIS — Z79890 Hormone replacement therapy (postmenopausal): Secondary | ICD-10-CM | POA: Diagnosis not present

## 2017-07-09 DIAGNOSIS — E875 Hyperkalemia: Secondary | ICD-10-CM | POA: Diagnosis present

## 2017-07-09 DIAGNOSIS — Z66 Do not resuscitate: Secondary | ICD-10-CM | POA: Diagnosis present

## 2017-07-09 DIAGNOSIS — I248 Other forms of acute ischemic heart disease: Secondary | ICD-10-CM | POA: Diagnosis present

## 2017-07-09 DIAGNOSIS — E785 Hyperlipidemia, unspecified: Secondary | ICD-10-CM | POA: Diagnosis present

## 2017-07-09 LAB — URINALYSIS, COMPLETE (UACMP) WITH MICROSCOPIC
Bilirubin Urine: NEGATIVE
Glucose, UA: NEGATIVE mg/dL
Ketones, ur: NEGATIVE mg/dL
Nitrite: NEGATIVE
Protein, ur: 100 mg/dL — AB
SPECIFIC GRAVITY, URINE: 1.014 (ref 1.005–1.030)
pH: 5 (ref 5.0–8.0)

## 2017-07-09 LAB — BLOOD CULTURE ID PANEL (REFLEXED)
ACINETOBACTER BAUMANNII: NOT DETECTED
CANDIDA GLABRATA: NOT DETECTED
CANDIDA KRUSEI: NOT DETECTED
CANDIDA PARAPSILOSIS: NOT DETECTED
Candida albicans: NOT DETECTED
Candida tropicalis: NOT DETECTED
Carbapenem resistance: NOT DETECTED
ENTEROBACTERIACEAE SPECIES: DETECTED — AB
ENTEROCOCCUS SPECIES: NOT DETECTED
ESCHERICHIA COLI: NOT DETECTED
Enterobacter cloacae complex: NOT DETECTED
Haemophilus influenzae: NOT DETECTED
KLEBSIELLA OXYTOCA: NOT DETECTED
KLEBSIELLA PNEUMONIAE: NOT DETECTED
LISTERIA MONOCYTOGENES: NOT DETECTED
Neisseria meningitidis: NOT DETECTED
PSEUDOMONAS AERUGINOSA: NOT DETECTED
Proteus species: NOT DETECTED
STREPTOCOCCUS PYOGENES: NOT DETECTED
Serratia marcescens: NOT DETECTED
Staphylococcus aureus (BCID): NOT DETECTED
Staphylococcus species: NOT DETECTED
Streptococcus agalactiae: NOT DETECTED
Streptococcus pneumoniae: NOT DETECTED
Streptococcus species: NOT DETECTED

## 2017-07-09 LAB — MRSA PCR SCREENING: MRSA by PCR: NEGATIVE

## 2017-07-09 LAB — LACTIC ACID, PLASMA: LACTIC ACID, VENOUS: 2 mmol/L — AB (ref 0.5–1.9)

## 2017-07-09 MED ORDER — ACETAMINOPHEN 325 MG PO TABS: 650.0000 mg | ORAL_TABLET | Freq: Four times a day (QID) | ORAL | Status: DC | PRN

## 2017-07-09 MED ORDER — HEPARIN SODIUM (PORCINE) 5000 UNIT/ML IJ SOLN
5000.0000 [IU] | Freq: Three times a day (TID) | INTRAMUSCULAR | Status: DC
  Administered 2017-07-09 – 2017-07-12 (×8): 5000 [IU] via SUBCUTANEOUS
  Filled 2017-07-09 (×7): qty 1

## 2017-07-09 MED ORDER — PANTOPRAZOLE SODIUM 40 MG PO TBEC
40.0000 mg | DELAYED_RELEASE_TABLET | Freq: Every day | ORAL | Status: DC
  Administered 2017-07-10 – 2017-07-12 (×3): 40 mg via ORAL
  Filled 2017-07-09 (×3): qty 1

## 2017-07-09 MED ORDER — SODIUM CHLORIDE 0.9 % IV BOLUS
1000.0000 mL | Freq: Once | INTRAVENOUS | Status: AC
  Administered 2017-07-09: 1000 mL via INTRAVENOUS

## 2017-07-09 MED ORDER — LEVOTHYROXINE SODIUM 88 MCG PO TABS
88.0000 ug | ORAL_TABLET | Freq: Every day | ORAL | Status: DC
  Administered 2017-07-09 – 2017-07-12 (×4): 88 ug via ORAL
  Filled 2017-07-09 (×5): qty 1

## 2017-07-09 MED ORDER — ONDANSETRON HCL 4 MG/2ML IJ SOLN: 4.0000 mg | Freq: Four times a day (QID) | INTRAMUSCULAR | Status: DC | PRN

## 2017-07-09 MED ORDER — VANCOMYCIN HCL IN DEXTROSE 750-5 MG/150ML-% IV SOLN
750.0000 mg | INTRAVENOUS | Status: DC
  Administered 2017-07-09: 750 mg via INTRAVENOUS
  Filled 2017-07-09 (×2): qty 150

## 2017-07-09 MED ORDER — ACETAMINOPHEN 650 MG RE SUPP: 650.0000 mg | Freq: Four times a day (QID) | RECTAL | Status: DC | PRN

## 2017-07-09 NOTE — ED Notes (Signed)
Dr Dolores FrameSung made aware at this time of pt's elevated Lactic level as reported by lab= Lactic 2.0 mmol/L

## 2017-07-09 NOTE — Progress Notes (Addendum)
[facility] [place] [facility]   PATIENT NAME: Victoria Colon    MR#:  [id number]  DATE OF BIRTH:  [birth year]  SUBJECTIVE:  CHIEF COMPLAINT:  Sob is better  REVIEW OF SYSTEMS:  CONSTITUTIONAL: No fever, fatigue or weakness.  EYES: No blurred or double vision.  EARS, NOSE, AND THROAT: No tinnitus or ear pain.  RESPIRATORY: No cough, shortness of breath, wheezing or hemoptysis.  CARDIOVASCULAR: No chest pain, orthopnea, edema.  GASTROINTESTINAL: No nausea, vomiting, diarrhea or abdominal pain.  GENITOURINARY: No dysuria, hematuria.  ENDOCRINE: No polyuria, nocturia,  HEMATOLOGY: No anemia, easy bruising or bleeding SKIN: No rash or lesion. MUSCULOSKELETAL: No joint pain or arthritis.   NEUROLOGIC: No tingling, numbness, weakness.  PSYCHIATRY: No anxiety or depression.   DRUG ALLERGIES:   Allergies  Allergen Reactions  . Ciprofloxacin     Heart dysrhythmia    VITALS:  Blood pressure (!) 134/58, pulse (!) 59, temperature 97.9 F (36.6 C), temperature source Oral, resp. rate 20, height 5' (1.524 m), weight 80.5 kg (177 lb 7.5 oz), SpO2 100 %.  PHYSICAL EXAMINATION:  GENERAL:  84 y.o.-year-old patient lying in the bed with no acute distress.  EYES: Pupils equal, round, reactive to light and accommodation. No scleral icterus. Extraocular muscles intact.  HEENT: Head atraumatic, normocephalic. Oropharynx and nasopharynx clear.  NECK:  Supple, no jugular venous distention. No thyroid enlargement, no tenderness.  LUNGS: Normal breath sounds bilaterally, no wheezing, rales,rhonchi or crepitation. No use of accessory muscles of respiration.  CARDIOVASCULAR: S1, S2 normal. No murmurs, rubs, or gallops.  ABDOMEN: Soft, nontender, nondistended. Bowel sounds present. No organomegaly or mass.  EXTREMITIES: No pedal edema, cyanosis, or clubbing.  NEUROLOGIC: Cranial nerves II through XII are intact. Muscle strength 5/5 in all extremities. Sensation intact.  Gait not checked.  PSYCHIATRIC: The patient is alert and oriented x 3.  SKIN: No obvious rash, lesion, or ulcer.    LABORATORY PANEL:   CBC Recent Labs  Lab 07/08/17 2246  WBC 15.3*  HGB 11.2*  HCT 34.6*  PLT 188   ------------------------------------------------------------------------------------------------------------------  Chemistries  Recent Labs  Lab 07/08/17 2246  NA 137  K 4.4  CL 101  CO2 25  GLUCOSE 186*  BUN 46*  CREATININE 1.92*  CALCIUM 8.3*  AST 61*  ALT 45  ALKPHOS 172*  BILITOT 1.5*   ------------------------------------------------------------------------------------------------------------------  Cardiac Enzymes Recent Labs  Lab 07/08/17 2246  TROPONINI 0.11*   ------------------------------------------------------------------------------------------------------------------  RADIOLOGY:  Dg Chest Port 1 View  Result Date: 07/08/2017 CLINICAL DATA:  Difficulty breathing EXAM: PORTABLE CHEST 1 VIEW FINDINGS: LEFT and RIGHT-sided pacemakers with multiple continuous leads overlies stable enlarged cardiac silhouette. There is increase in central venous congestion. Bilateral pleural effusions. No pneumothorax. IMPRESSION: Cardiomegaly, basilar atelectasis, and bilateral effusions. Electronically Signed   By: Genevive Bi M.D.   On: 07/08/2017 22:45    EKG:   Orders placed or performed in visit on 06/22/06  . EKG 12-Lead    ASSESSMENT AND PLAN:      #Acute hypoxic respiratory failure secondary to pneumonia Clinically better Off BiPAP, currently on oxygen via nasal cannula Continue IV antibiotics and bronchodilator treatment  #Severe sepsis (HCC) -source of sepsis is pneumonia ,blood cultures are positive with Enterobacter in both aerobic and anaerobic bottles IV antibiotics  lactic acid mildly elevated trend lactate until within normal limits IV fluids  #  CAP (community acquired pneumonia) -IV antibiotics cefepime and  vancomycin, PRN supportive treatment  #  Acute renal  failure superimposed on stage 3 chronic kidney disease (HCC) -IV fluids as above, avoid nephrotoxins and monitor for improvement   # Atrial fibrillation, chronic (HCC) -continue rate controlling medications  #  HTN (hypertension) -Home medication Cozaar is on hold as blood pressure is soft and also secondary to acute renal failure    #OSA (obstructive sleep apnea) - continue CPAP nightly  #  Hypothyroidism -continue Synthroid       All the records are reviewed and case discussed with Care Management/Social Workerr. Management plans discussed with the patient, family and they are in agreement.  CODE STATUS: DNR  TOTAL TIME TAKING CARE OF THIS PATIENT: 35 minutes.   POSSIBLE D/C IN  DAYS, DEPENDING ON CLINICAL CONDITION.  Note: This dictation was prepared with Dragon dictation along with smaller phrase technology. Any transcriptional errors that result from this process are unintentional.   Ramonita LabAruna Chevelle Durr M.D on 07/09/2017 at 5:25 PM  Between 7am to 6pm - Pager - [phone] After 6pm go to [email or web address] - password [email or web address] [facility]  [facility]  Office  [phone]  CC: Primary care physician; Barbette ReichmannHande, Vishwanath, MD

## 2017-07-09 NOTE — ED Notes (Addendum)
Per admitting MD, pt ok to go to inpatient unit now that she is tolerating [place] and has been off bipap.  Pt resting comfortably at this time. Will continue to monitor.

## 2017-07-09 NOTE — Progress Notes (Signed)
PHARMACY - PHYSICIAN COMMUNICATION CRITICAL VALUE ALERT - BLOOD CULTURE IDENTIFICATION (BCID)  No results found for this or any previous visit.  Lab reports 2 of 4 bottles (anaerobic bottles of 2 sets) with enterobacteriaceae species - no species detected.  Name of physician (or Provider) Contacted: Dr. Amado CoeGouru x 1 at 11:48  Changes to prescribed antibiotics required: Will continue cefepime as ordered  Carola FrostNathan A Roxsana Riding, Pharm.D., BCPS Clinical Pharmacist 07/09/2017  11:49 AM

## 2017-07-09 NOTE — ED Notes (Signed)
Admitting MD at bedside. This RN paged MD to determine if pt was safe to start weaning off BiPAP. Dr. Amado CoeGouru agrees that this is a good idea. RRT paged; will come down to try to wean pt shortly.   Pt in NAD at this time. Unlabored and even respirations. MAP well above 60 (see VS; last MAP 128). Pt alert, oriented and able to follow commands. Will continue to monitor.

## 2017-07-09 NOTE — ED Notes (Signed)
CBC & creatinine already drawn. Order states to draw only if not drawn. Drawn and resulted 4/19.

## 2017-07-09 NOTE — ED Notes (Signed)
Pt tolerating 3L [place] very well. Pt resting comfortably. This RN attempting to page Dr. Amado CoeGouru now to request order for bed placement changed to floor d/t pt tolerating being off BiPAP.

## 2017-07-09 NOTE — ED Notes (Signed)
Waiting to give pt protonix d/t her finally getting to rest. Family prefers.

## 2017-07-09 NOTE — Progress Notes (Signed)
Family Meeting Note  Advance Directive:yes  Today a meeting took place with the Patient, 2 daughters at bedside   The following clinical team members were present during this meeting:MD  The following were discussed:Patient's diagnosis: Acute hypoxic respiratory failure, severe sepsis, community-acquired pneumonia, septicemia, acute renal failure on chronic kidney disease stage III, hypertension, obstructive sleep apnea, chronic atrial fibrillation, treatment plan of care discussed in detail with the patient and 2 daughters at bedside.   Patient's progosis: Unable to determine and Goals for treatment: DNR, 2 daughters Gigi Gineggy and Darl PikesSusan are healthcare power of attorney  Additional follow-up to be provided: Hospitalist  Time spent during discussion:17MIN  Ramonita LabAruna Therman Hughlett, MD

## 2017-07-09 NOTE — ED Notes (Signed)
Pt tolerating 3L [place] very well. No increase in work of breathing. Pt reports no shortness of breath. O2 sat maintaining at 99-100% on 3L. Dr. Amado CoeGouru aware. Would like me to call her back at 11:30 and give new report before she changes her order for ICU placement. Will continue to monitor.

## 2017-07-09 NOTE — ED Notes (Signed)
Attempted to call report. Cicero DuckErika, RN stated that the pt's O2 level at 40% on BiPap is "not enough" and would talk to the NP about questioning appropriate placement. She called back to state they would not be able to accept pt to Step-Down unit at this time. Elon JesterMichele, Charge RN made aware.

## 2017-07-09 NOTE — ED Notes (Signed)
RRT at bedside to try to wean pt off BiPAP.

## 2017-07-09 NOTE — ED Notes (Addendum)
Pt resting comfortably at this time with family at the bedside. Will continue to monitor.

## 2017-07-09 NOTE — Progress Notes (Signed)
Pharmacy Antibiotic Note  Victoria Colon is a 84 y.o. female admitted on 07/08/2017 with pneumonia.  Pharmacy has been consulted for vancomycin and cefepime dosing.  Plan: DW 64kg  Vd 45L [place] 0.018 hr-1  T1/2 39 hours Vancomycin 1 gram q 48 hours ordered with stacked dosing. Level before 3rd dose. Goal trough 15-20.  Cefepime 2 grams q 24 hours ordered.  Height: 5' (152.4 cm) Weight: 200 lb (90.7 kg) IBW/kg (Calculated) : 45.5  Temp (24hrs), Avg:100.6 F (38.1 C), Min:100.6 F (38.1 C), Max:100.6 F (38.1 C)  Recent Labs  Lab 07/08/17 2246  WBC 15.3*  CREATININE 1.92*  LATICACIDVEN 2.4*    Estimated Creatinine Clearance: 16 mL/min (A) (by C-G formula based on SCr of 1.92 mg/dL (H)).    Allergies  Allergen Reactions  . Ciprofloxacin     Heart dysrhythmia    Antimicrobials this admission: Vancomycin, cefepime 4/19  >>    >>   Dose adjustments this admission:   Microbiology results: 4/19 BCx: pending 4/19 UCx: pending  4/19 MRSA PCR: pending      4/19 UA: pending  Thank you for allowing pharmacy to be a part of this patient's care.  Gustavia Carie S 07/09/2017 12:00 AM

## 2017-07-09 NOTE — Progress Notes (Signed)
Pharmacy Antibiotic Note  Victoria Colon is a 84 y.o. female admitted on 07/08/2017 with pneumonia.  Pharmacy has been consulted for vancomycin and cefepime dosing.  Plan: DW 64kg  Vd 45L [place] 0.018 hr-1  T1/2 39 hours Vancomycin 1 gram q 48 hours ordered with stacked dosing. Level before 3rd dose. Goal trough 15-20.  Cefepime 2 grams q 24 hours ordered.  4/20: change in weight from 90.7 kg to 80.5 kg Decrease dose to vanc 750 mg IV q48 h for goal 15-20  Height: 5' (152.4 cm) Weight: 177 lb 7.5 oz (80.5 kg) IBW/kg (Calculated) : 45.5  Temp (24hrs), Avg:99.3 F (37.4 C), Min:97.9 F (36.6 C), Max:100.6 F (38.1 C)  Recent Labs  Lab 07/08/17 2246 07/09/17 0121  WBC 15.3*  --   CREATININE 1.92*  --   LATICACIDVEN 2.4* 2.0*    Estimated Creatinine Clearance: 15 mL/min (A) (by C-G formula based on SCr of 1.92 mg/dL (H)).    Allergies  Allergen Reactions  . Ciprofloxacin     Heart dysrhythmia    Antimicrobials this admission: Vancomycin, cefepime 4/19  >>    >>   Dose adjustments this admission:   Microbiology results: 4/19 BCx: pending 4/19 UCx: pending  4/19 MRSA PCR: pending      4/19 UA: pending  Thank you for allowing pharmacy to be a part of this patient's care.  Marty HeckWang, Victoria Colon 07/09/2017 4:07 PM

## 2017-07-09 NOTE — ED Notes (Signed)
RRT at bedside

## 2017-07-09 NOTE — H&P (Addendum)
[place] [facility] [place] NAME: Victoria Colon    MR#:  [id number]  DATE OF BIRTH:  Oct 13, 1916  DATE OF ADMISSION:  07/08/2017  PRIMARY CARE PHYSICIAN: Tracie Harrier, MD   REQUESTING/REFERRING PHYSICIAN: Quentin Cornwall, MD  CHIEF COMPLAINT:   Chief Complaint  Patient presents with  . Respiratory Distress    HISTORY OF PRESENT ILLNESS:  Victoria Colon  is a 84 y.o. female who presents with fever, increased cough, respiratory distress.  Here in the ED the patient required BiPAP for increased work of breathing.  She was found to have pneumonia on imaging, and met sepsis criteria.  Hospitalist were called for admission.  Patient does not contribute information to HPI.  Family at bedside states that she has been having increased cough the past couple of days, nonproductive.  However, they state that she always has a cough and so they did not think much of this.  Today she was more somnolent, and febrile and so they brought her to the ED.  PAST MEDICAL HISTORY:   Past Medical History:  Diagnosis Date  . Acquired hypothyroidism   . Benign essential hypertension   . Bilateral leg edema   . Cardiac pacemaker   . Chronic a-fib ([place])   . CKD (chronic kidney disease)   . Complete heart block ([facility])   . HOH (hard of hearing)   . Hyperlipidemia   . OSA (obstructive sleep apnea)   . PVD (peripheral vascular disease) ([place])      PAST SURGICAL HISTORY:   Past Surgical History:  Procedure Laterality Date  . THYROIDECTOMY       SOCIAL HISTORY:   Social History   Tobacco Use  . Smoking status: Unknown If Ever Smoked  . Smokeless tobacco: Never Used  Substance Use Topics  . Alcohol use: Not Currently     FAMILY HISTORY:   Family History  Problem Relation Age of Onset  . Throat cancer Brother      DRUG ALLERGIES:   Allergies  Allergen Reactions  . Ciprofloxacin     Heart dysrhythmia    MEDICATIONS AT HOME:   Prior to  Admission medications   Medication Sig Start Date End Date Taking? Authorizing Provider  docusate sodium (COLACE) 100 MG capsule Take 100 mg by mouth 2 (two) times daily as needed for mild constipation.   Yes [provider]  ferrous sulfate 325 (65 FE) MG tablet Take 325 mg by mouth daily with breakfast.   Yes [provider]  levothyroxine (SYNTHROID, LEVOTHROID) 88 MCG tablet Take 88 mcg by mouth daily before breakfast.   Yes [provider]  losartan (COZAAR) 100 MG tablet Take 100 mg by mouth daily.   Yes [provider]  metolazone (ZAROXOLYN) 2.5 MG tablet Take 2.5 mg by mouth once a week.   Yes [provider]  omeprazole (PRILOSEC) 20 MG capsule Take 20 mg by mouth daily.   Yes [provider]  pravastatin (PRAVACHOL) 20 MG tablet Take 20 mg by mouth every evening.   Yes [provider]  torsemide (DEMADEX) 10 MG tablet Take 10 mg by mouth daily.   Yes [provider]  vitamin B-12 (CYANOCOBALAMIN) 1000 MCG tablet Take 1,000 mcg by mouth daily.   Yes [provider]    REVIEW OF SYSTEMS:  Review of Systems  Constitutional: Positive for fever and malaise/fatigue. Negative for chills and weight loss.  HENT: Negative for ear pain, hearing loss and tinnitus.  Eyes: Negative for blurred vision, double vision, pain and redness.  Respiratory: Positive for cough and shortness of breath. Negative for hemoptysis.   Cardiovascular: Negative for chest pain, palpitations, orthopnea and leg swelling.  Gastrointestinal: Negative for abdominal pain, constipation, diarrhea, nausea and vomiting.  Genitourinary: Negative for dysuria, frequency and hematuria.  Musculoskeletal: Negative for back pain, joint pain and neck pain.  Skin:       No acne, rash, or lesions  Neurological: Negative for dizziness, tremors, focal weakness and weakness.  Endo/Heme/Allergies: Negative for polydipsia. Does not bruise/bleed easily.   Psychiatric/Behavioral: Negative for depression. The patient is not nervous/anxious and does not have insomnia.      VITAL SIGNS:   Vitals:   07/08/17 2225 07/08/17 2320 07/08/17 2339 07/08/17 2345  BP:  95/71 (!) 151/44   Pulse: 76 69 67   Resp: (!) 36 18 16   Temp:      TempSrc:      SpO2: 96% 100% 99%   Weight:      Height:    5' (1.524 m)   Wt Readings from Last 3 Encounters:  07/08/17 90.7 kg (200 lb)    PHYSICAL EXAMINATION:  Physical Exam  Vitals reviewed. Constitutional: She is oriented to person, place, and time. She appears well-developed and well-nourished. No distress.  HENT:  Head: Normocephalic and atraumatic.  Mouth/Throat: Oropharynx is clear and moist.  Eyes: Pupils are equal, round, and reactive to light. Conjunctivae and EOM are normal. No scleral icterus.  Neck: Normal range of motion. Neck supple. No JVD present. No thyromegaly present.  Cardiovascular: Normal rate, regular rhythm and intact distal pulses. Exam reveals no gallop and no friction rub.  No murmur heard. Respiratory: She is in respiratory distress (On BiPAP). She has no wheezes. She has no rales.  Decreased breath sounds bilateral bases  GI: Soft. Bowel sounds are normal. She exhibits no distension. There is no tenderness.  Musculoskeletal: Normal range of motion. She exhibits no edema.  No arthritis, no gout  Lymphadenopathy:    She has no cervical adenopathy.  Neurological: She is alert and oriented to person, place, and time. No cranial nerve deficit.  No dysarthria, no aphasia  Skin: Skin is warm and dry. No rash noted. No erythema.  Psychiatric: She has a normal mood and affect. Her behavior is normal. Judgment and thought content normal.    LABORATORY PANEL:   CBC Recent Labs  Lab 07/08/17 2246  WBC 15.3*  HGB 11.2*  HCT 34.6*  PLT 188   ------------------------------------------------------------------------------------------------------------------  Chemistries   Recent Labs  Lab 07/08/17 2246  NA 137  K 4.4  CL 101  CO2 25  GLUCOSE 186*  BUN 46*  CREATININE 1.92*  CALCIUM 8.3*  AST 61*  ALT 45  ALKPHOS 172*  BILITOT 1.5*   ------------------------------------------------------------------------------------------------------------------  Cardiac Enzymes Recent Labs  Lab 07/08/17 2246  TROPONINI 0.11*   ------------------------------------------------------------------------------------------------------------------  RADIOLOGY:  Dg Chest Port 1 View  Result Date: 07/08/2017 CLINICAL DATA:  Difficulty breathing EXAM: PORTABLE CHEST 1 VIEW FINDINGS: LEFT and RIGHT-sided pacemakers with multiple continuous leads overlies stable enlarged cardiac silhouette. There is increase in central venous congestion. Bilateral pleural effusions. No pneumothorax. IMPRESSION: Cardiomegaly, basilar atelectasis, and bilateral effusions. Electronically Signed   By: Suzy Bouchard M.D.   On: 07/08/2017 22:45    EKG:   Orders placed or performed in visit on 06/22/06  . EKG 12-Lead    IMPRESSION AND PLAN:  Principal Problem:   Severe sepsis ([place]) -  IV antibiotics, lactic acid mildly elevated, IV fluids in place and trend lactate until within normal limits, blood pressure stable, cultures sent from the ED, source is pneumonia as below. Active Problems:   CAP (community acquired pneumonia) -IV antibiotics, PRN supportive treatment   Acute renal failure superimposed on stage 3 chronic kidney disease (HCC) -IV fluids as above, avoid nephrotoxins and monitor for improvement   Atrial fibrillation, chronic ([place]) -continue home rate controlling medications   HTN (hypertension) -continue home meds   OSA (obstructive sleep apnea) -patient is currently on BiPAP   Hypothyroidism -home dose thyroid replacement  Chart review performed and case discussed with ED provider. Labs, imaging and/or ECG reviewed by provider and discussed with patient/family. Management  plans discussed with the patient and/or family.  DVT PROPHYLAXIS: SubQ lovenox  GI PROPHYLAXIS: None  ADMISSION STATUS: Inpatient  CODE STATUS: DNR    Code Status Orders  (From admission, onward)        Start     Ordered   07/08/17 2308  Do not attempt resuscitation/DNR  Continuous    Question Answer Comment  In the event of cardiac or respiratory ARREST Do not call a "code blue"   In the event of cardiac or respiratory ARREST Do not perform Intubation, CPR, defibrillation or ACLS   In the event of cardiac or respiratory ARREST Use medication by any route, position, wound care, and other measures to relive pain and suffering. May use oxygen, suction and manual treatment of airway obstruction as needed for comfort.      07/08/17 2307    Code Status History    This patient has a current code status but no historical code status.    DNR  TOTAL TIME TAKING CARE OF THIS PATIENT: 45 minutes.   Victoria Colon [place] 07/09/2017, 12:00 AM  [facility]  Office  [phone]  CC: Primary care physician; Tracie Harrier, MD  Note:  This document was prepared using Dragon voice recognition software and may include unintentional dictation errors.

## 2017-07-10 ENCOUNTER — Inpatient Hospital Stay (HOSPITAL_COMMUNITY)
Admit: 2017-07-10 | Discharge: 2017-07-10 | Disposition: A | Discharge status: Home / Self Care | Payer: Medicare HMO | Attending: Internal Medicine | Admitting: Internal Medicine

## 2017-07-10 DIAGNOSIS — R0600 Dyspnea, unspecified: Secondary | ICD-10-CM

## 2017-07-10 LAB — BASIC METABOLIC PANEL
Anion gap: 7 (ref 5–15)
BUN: 60 mg/dL — AB (ref 6–20)
CALCIUM: 8 mg/dL — AB (ref 8.9–10.3)
CHLORIDE: 106 mmol/L (ref 101–111)
CO2: 26 mmol/L (ref 22–32)
CREATININE: 2.57 mg/dL — AB (ref 0.44–1.00)
GFR calc non Af Amer: 14 mL/min — ABNORMAL LOW (ref >60)
GFR, EST AFRICAN AMERICAN: 17 mL/min — AB (ref >60)
Glucose, Bld: 117 mg/dL — ABNORMAL HIGH (ref 65–99)
Potassium: 5.3 mmol/L — ABNORMAL HIGH (ref 3.5–5.1)
SODIUM: 139 mmol/L (ref 135–145)

## 2017-07-10 LAB — CBC
HCT: 33.5 % — ABNORMAL LOW (ref 35.0–47.0)
HEMOGLOBIN: 10.9 g/dL — AB (ref 12.0–16.0)
MCH: 29.1 pg (ref 26.0–34.0)
MCHC: 32.5 g/dL (ref 32.0–36.0)
MCV: 89.6 fL (ref 80.0–100.0)
Platelets: 144 10*3/uL — ABNORMAL LOW (ref 150–440)
RBC: 3.74 MIL/uL — ABNORMAL LOW (ref 3.80–5.20)
RDW: 15.3 % — AB (ref 11.5–14.5)
WBC: 14.9 10*3/uL — ABNORMAL HIGH (ref 3.6–11.0)

## 2017-07-10 LAB — URINE CULTURE: Culture: NO GROWTH

## 2017-07-10 LAB — LACTIC ACID, PLASMA
LACTIC ACID, VENOUS: 1.8 mmol/L (ref 0.5–1.9)
LACTIC ACID, VENOUS: 1.9 mmol/L (ref 0.5–1.9)

## 2017-07-10 LAB — POTASSIUM: Potassium: 4.5 mmol/L (ref 3.5–5.1)

## 2017-07-10 LAB — ECHOCARDIOGRAM COMPLETE
HEIGHTINCHES: 60 in
WEIGHTICAEL: 2832 [oz_av]

## 2017-07-10 MED ORDER — SODIUM CHLORIDE 0.9 % IV SOLN
INTRAVENOUS | Status: AC
  Administered 2017-07-10 – 2017-07-11 (×2): via INTRAVENOUS

## 2017-07-10 MED ORDER — SODIUM POLYSTYRENE SULFONATE 15 GM/60ML PO SUSP
15.0000 g | Freq: Once | ORAL | Status: AC
  Administered 2017-07-10: 08:00:00 15 g via ORAL
  Filled 2017-07-10: qty 60

## 2017-07-10 NOTE — Evaluation (Signed)
Physical Therapy Evaluation Patient Details Name: Victoria Colon MRN: [id number] DOB: [birth year] Today's Date: 07/10/2017   History of Present Illness  84 yo female with acute hypoxic respiratory failure and PNA was admitted, noted troponins elevated, cardiomegaly, atelectasis, B pleural effusion.  Had acute renal failure, ckd 3, a-fib, HTN, OSA  Clinical Impression  Pt was assessed for strength and balance as well as O2 sats with sitting bedside, then to stand with walker and take sidesteps.  Her efforts were a struggle and talked with family about a SNF stay to increase her independence as pt has been much stronger in the past.  Her plan is to follow acutely for her progressed gait and to try to increase endurance as able.  Her family does plan to take her home when the time is up for the rehab.  Daughter who is caregiver is present for evaluation.  Follow as ordered and will expect her to follow up with HHPT after SNF.    Follow Up Recommendations SNF    Equipment Recommendations  None recommended by PT    Recommendations for Other Services       Precautions / Restrictions Precautions Precautions: Fall Restrictions Weight Bearing Restrictions: No      Mobility  Bed Mobility Overal bed mobility: Needs Assistance Bed Mobility: Supine to Sit;Sit to Supine     Supine to sit: Mod assist Sit to supine: Mod assist      Transfers Overall transfer level: Needs assistance Equipment used: Rolling walker (2 wheeled);1 person hand held assist Transfers: Sit to/from Stand Sit to Stand: Mod assist         General transfer comment: pt needs mod assist to power up and control initial standign  Ambulation/Gait Ambulation/Gait assistance: Min assist Ambulation Distance (Feet): 5 Feet Assistive device: Rolling walker (2 wheeled) Gait Pattern/deviations: Step-to pattern;Decreased stride length;Wide base of support;Trunk flexed;Shuffle;Decreased weight shift to left Gait velocity:  reduced Gait velocity interpretation: <1.8 ft/sec, indicate of risk for recurrent falls General Gait Details: heavy support on RW for sidestepping up bed  Stairs            Wheelchair Mobility    Modified Rankin (Stroke Patients Only)       Balance Overall balance assessment: Needs assistance Sitting-balance support: Feet supported;Bilateral upper extremity supported Sitting balance-Leahy Scale: Fair     Standing balance support: Bilateral upper extremity supported Standing balance-Leahy Scale: Poor                               Pertinent Vitals/Pain Pain Assessment: No/denies pain    Home Living Family/patient expects to be discharged to:: Private residence Living Arrangements: Children Available Help at Discharge: Family;Available 24 hours/day Type of Home: House Home Access: Stairs to enter Entrance Stairs-Rails: Left Entrance Stairs-Number of Steps: 3 Home Layout: One level Home Equipment: Environmental consultant - 4 wheels Additional Comments: home wth daughter    Prior Function Level of Independence: Independent with assistive device(s)               Hand Dominance   Dominant Hand: Right    Extremity/Trunk Assessment   Upper Extremity Assessment Upper Extremity Assessment: Generalized weakness    Lower Extremity Assessment Lower Extremity Assessment: Generalized weakness    Cervical / Trunk Assessment Cervical / Trunk Assessment: Kyphotic  Communication   Communication: No difficulties  Cognition Arousal/Alertness: Lethargic Behavior During Therapy: Flat affect Overall Cognitive Status: Within Functional Limits for tasks assessed  General Comments: pt is sleepy but following instructions for mobility      General Comments      Exercises     Assessment/Plan    PT Assessment Patient needs continued PT services  PT Problem List Decreased strength;Decreased range of motion;Decreased  activity tolerance;Decreased balance;Decreased mobility;Decreased coordination;Decreased cognition;Decreased knowledge of use of DME;Decreased safety awareness;Decreased knowledge of precautions;Cardiopulmonary status limiting activity       PT Treatment Interventions DME instruction;Gait training;Stair training;Functional mobility training;Therapeutic activities;Therapeutic exercise;Balance training;Neuromuscular re-education;Patient/family education    PT Goals (Current goals can be found in the Care Plan section)  Acute Rehab PT Goals Patient Stated Goal: none stated PT Goal Formulation: With patient/family Time For Goal Achievement: 07/24/17 Potential to Achieve Goals: Fair    Frequency Min 2X/week   Barriers to discharge Inaccessible home environment stairs to enter house    Co-evaluation               AM-PAC PT "6 Clicks" Daily Activity  Outcome Measure Difficulty turning over in bed (including adjusting bedclothes, sheets and blankets)?: Unable Difficulty moving from lying on back to sitting on the side of the bed? : Unable Difficulty sitting down on and standing up from a chair with arms (e.g., wheelchair, bedside commode, etc,.)?: Unable Help needed moving to and from a bed to chair (including a wheelchair)?: A Lot Help needed walking in hospital room?: A Lot Help needed climbing 3-5 steps with a railing? : Total 6 Click Score: 8    End of Session Equipment Utilized During Treatment: Gait belt;Oxygen Activity Tolerance: Patient limited by fatigue;Other (comment)(O2 sats with O2 in place) Patient left: in bed;with call bell/phone within reach;with bed alarm set;with family/visitor present Nurse Communication: Mobility status PT Visit Diagnosis: Unsteadiness on feet (R26.81);Muscle weakness (generalized) (M62.81);Difficulty in walking, not elsewhere classified (R26.2);Adult, failure to thrive (R62.7)    Time: [id number] PT Time Calculation (min) (ACUTE ONLY): 28  min   Charges:   PT Evaluation $PT Eval Moderate Complexity: 1 Mod PT Treatments $Therapeutic Activity: 8-22 mins   PT G Codes:   PT G-Codes **NOT FOR INPATIENT CLASS** Functional Assessment Tool Used: AM-PAC 6 Clicks Basic Mobility    Ivar DrapeRuth E Fleta Borgeson 07/10/2017, 10:03 PM   Samul Dadauth Thelton Graca, PT MS Acute Rehab Dept. Number: [facility] [phone] and [facility] [phone]

## 2017-07-10 NOTE — Progress Notes (Signed)
[facility] [place] at [facility]   PATIENT NAME: Victoria Colon    MR#:  [id number]  DATE OF BIRTH:  July 16, 1916  SUBJECTIVE:  CHIEF COMPLAINT:  Sob is better but still coughing and feeling weak  REVIEW OF SYSTEMS:  CONSTITUTIONAL: No fever, fatigue or weakness.  EYES: No blurred or double vision.  EARS, NOSE, AND THROAT: No tinnitus or ear pain.  RESPIRATORY: Reporting intermittent episodes of cough, shortness of breath with exertion, no wheezing or hemoptysis.  CARDIOVASCULAR: No chest pain, orthopnea, edema.  GASTROINTESTINAL: No nausea, vomiting, diarrhea or abdominal pain.  GENITOURINARY: No dysuria, hematuria.  ENDOCRINE: No polyuria, nocturia,  HEMATOLOGY: No anemia, easy bruising or bleeding SKIN: No rash or lesion. MUSCULOSKELETAL: No joint pain or arthritis.   NEUROLOGIC: No tingling, numbness, weakness.  PSYCHIATRY: No anxiety or depression.   DRUG ALLERGIES:   Allergies  Allergen Reactions  . Ciprofloxacin     Heart dysrhythmia    VITALS:  Blood pressure (!) 146/67, pulse 63, temperature 98.2 F (36.8 C), temperature source Oral, resp. rate 18, height 5' (1.524 m), weight 80.3 kg (177 lb), SpO2 100 %.  PHYSICAL EXAMINATION:  GENERAL:  84 y.o.-year-old patient lying in the bed with no acute distress.  EYES: Pupils equal, round, reactive to light and accommodation. No scleral icterus. Extraocular muscles intact.  HEENT: Head atraumatic, normocephalic. Oropharynx and nasopharynx clear.  NECK:  Supple, no jugular venous distention. No thyroid enlargement, no tenderness.  LUNGS: Moderate breath sounds bilaterally, no wheezing, rales,rhonchi or crepitation. No use of accessory muscles of respiration.  CARDIOVASCULAR: S1, S2 normal. No murmurs, rubs, or gallops.  ABDOMEN: Soft, nontender, nondistended. Bowel sounds present. No organomegaly or mass.  EXTREMITIES: No pedal edema, cyanosis, or clubbing.  NEUROLOGIC: Cranial nerves II  through XII are intact. Muscle strength 5/5 in all extremities. Sensation intact. Gait not checked.  PSYCHIATRIC: The patient is alert and oriented x 3.  SKIN: No obvious rash, lesion, or ulcer.    LABORATORY PANEL:   CBC Recent Labs  Lab 07/10/17 0422  WBC 14.9*  HGB 10.9*  HCT 33.5*  PLT 144*   ------------------------------------------------------------------------------------------------------------------  Chemistries  Recent Labs  Lab 07/08/17 2246 07/10/17 0422  NA 137 139  K 4.4 5.3*  CL 101 106  CO2 25 26  GLUCOSE 186* 117*  BUN 46* 60*  CREATININE 1.92* 2.57*  CALCIUM 8.3* 8.0*  AST 61*  --   ALT 45  --   ALKPHOS 172*  --   BILITOT 1.5*  --    ------------------------------------------------------------------------------------------------------------------  Cardiac Enzymes Recent Labs  Lab 07/08/17 2246  TROPONINI 0.11*   ------------------------------------------------------------------------------------------------------------------  RADIOLOGY:  Dg Chest Port 1 View  Result Date: 07/08/2017 CLINICAL DATA:  Difficulty breathing EXAM: PORTABLE CHEST 1 VIEW FINDINGS: LEFT and RIGHT-sided pacemakers with multiple continuous leads overlies stable enlarged cardiac silhouette. There is increase in central venous congestion. Bilateral pleural effusions. No pneumothorax. IMPRESSION: Cardiomegaly, basilar atelectasis, and bilateral effusions. Electronically Signed   By: Genevive Bi M.D.   On: 07/08/2017 22:45    EKG:   Orders placed or performed in visit on 06/22/06  . EKG 12-Lead    ASSESSMENT AND PLAN:      #Acute hypoxic respiratory failure secondary to pneumonia Clinically better Off BiPAP, currently on oxygen via nasal cannula Continue IV antibiotics and bronchodilator treatment  #Severe sepsis (HCC) -source of sepsis is pneumonia ,blood cultures are positive with Salmonella in both aerobic and anaerobic bottles IV antibiotics cefepime  will  be continued while sensitivities are pending MRSA PCR negative we will discontinue vancomycin  lactic acid mildly elevated trend lactate until within normal limits IV fluids  #Hyperkalemia Kayexalate and repeat BMP in a.m.  #  CAP (community acquired pneumonia) -IV antibiotics cefepime and vancomycin, PRN supportive treatment Echo done results pending  #  Acute renal failure superimposed on stage 3 chronic kidney disease (HCC) -IV fluids as above, avoid nephrotoxins and monitor for improvement Creatinine 1.92-2.57, 2 might be her baseline, decreased urine output -Gentle hydration with IV fluids and repeat BMP in a.m.   # Atrial fibrillation, chronic (HCC) -continue rate controlling medications  #  HTN (hypertension) -Home medication Cozaar is on hold as blood pressure is soft and also secondary to acute renal failure    #OSA (obstructive sleep apnea) - continue CPAP nightly  #  Hypothyroidism -continue Synthroid    Generalized weakness physical therapy consult   All the records are reviewed and case discussed with Care Management/Social Workerr. Management plans discussed with the patient, family and they are in agreement.  CODE STATUS: DNR  TOTAL TIME TAKING CARE OF THIS PATIENT: 35 minutes.   POSSIBLE D/C IN  1 DAYS, DEPENDING ON CLINICAL CONDITION.  Note: This dictation was prepared with Dragon dictation along with smaller phrase technology. Any transcriptional errors that result from this process are unintentional.   Ramonita LabAruna Makinze Jani M.D on 07/10/2017 at 10:55 AM  Between 7am to 6pm - Pager - [phone] After 6pm go to [email or web address] - password [email or web address] [facility]  [facility] [place]  Office  [phone]  CC: Primary care physician; Barbette ReichmannHande, Vishwanath, MD

## 2017-07-11 ENCOUNTER — Inpatient Hospital Stay: Payer: Medicare HMO

## 2017-07-11 LAB — BASIC METABOLIC PANEL
Anion gap: 7 (ref 5–15)
BUN: 62 mg/dL — AB (ref 6–20)
CHLORIDE: 107 mmol/L (ref 101–111)
CO2: 25 mmol/L (ref 22–32)
CREATININE: 2.19 mg/dL — AB (ref 0.44–1.00)
Calcium: 7.7 mg/dL — ABNORMAL LOW (ref 8.9–10.3)
GFR calc Af Amer: 20 mL/min — ABNORMAL LOW (ref >60)
GFR calc non Af Amer: 17 mL/min — ABNORMAL LOW (ref >60)
Glucose, Bld: 118 mg/dL — ABNORMAL HIGH (ref 65–99)
Potassium: 4.5 mmol/L (ref 3.5–5.1)
SODIUM: 139 mmol/L (ref 135–145)

## 2017-07-11 LAB — CBC
HCT: 30.9 % — ABNORMAL LOW (ref 35.0–47.0)
Hemoglobin: 10.2 g/dL — ABNORMAL LOW (ref 12.0–16.0)
MCH: 29.4 pg (ref 26.0–34.0)
MCHC: 33 g/dL (ref 32.0–36.0)
MCV: 89.1 fL (ref 80.0–100.0)
PLATELETS: 131 10*3/uL — AB (ref 150–440)
RBC: 3.47 MIL/uL — ABNORMAL LOW (ref 3.80–5.20)
RDW: 15.3 % — AB (ref 11.5–14.5)
WBC: 11.3 10*3/uL — ABNORMAL HIGH (ref 3.6–11.0)

## 2017-07-11 LAB — CULTURE, BLOOD (ROUTINE X 2): SPECIAL REQUESTS: ADEQUATE

## 2017-07-11 MED ORDER — AMLODIPINE BESYLATE 5 MG PO TABS
5.0000 mg | ORAL_TABLET | Freq: Every day | ORAL | Status: DC
  Administered 2017-07-11 – 2017-07-12 (×2): 5 mg via ORAL
  Filled 2017-07-11 (×2): qty 1

## 2017-07-11 MED ORDER — FUROSEMIDE 10 MG/ML IJ SOLN
120.0000 mg | Freq: Once | INTRAMUSCULAR | Status: AC
Start: 2017-07-11 — End: 2017-07-11
  Administered 2017-07-11: 120 mg via INTRAVENOUS
  Filled 2017-07-11: qty 2

## 2017-07-11 NOTE — NC FL2 (Signed)
[place] MEDICAID FL2 LEVEL OF CARE SCREENING TOOL     IDENTIFICATION  Patient Name: Victoria Colon Birthdate: [birth year] Sex: female Admission Date (Current Location): 07/08/2017  [place]   Facility and Address:  [facility], [address], [place], [place] [address]      Provider Number: [id number]  Attending Physician Name and Address:  Ramonita LabGouru, Aruna, MD  Relative Name and Phone Number:       Current Level of Care: Hospital Recommended Level of Care: Skilled Nursing Facility Prior Approval Number:    Date Approved/Denied:   PASRR Number: ([id number] )  Discharge Plan: SNF    Current Diagnoses: Patient Active Problem List   Diagnosis Date Noted  . Acute respiratory failure (HCC) 07/09/2017  . Severe sepsis (HCC) 07/08/2017  . CAP (community acquired pneumonia) 07/08/2017  . OSA (obstructive sleep apnea) 07/08/2017  . Atrial fibrillation, chronic (HCC) 07/08/2017  . HTN (hypertension) 07/08/2017  . Hypothyroidism 07/08/2017  . Acute renal failure superimposed on stage 3 chronic kidney disease (HCC) 07/08/2017    Orientation RESPIRATION BLADDER Height & Weight     Self, Place  O2(3 Liters Oxygen. ) Incontinent Weight: 186 lb 8.2 oz (84.6 kg) Height:  5' (152.4 cm)  BEHAVIORAL SYMPTOMS/MOOD NEUROLOGICAL BOWEL NUTRITION STATUS      Incontinent Diet(Diet: Heart Healthy )  AMBULATORY STATUS COMMUNICATION OF NEEDS Skin   Extensive Assist Verbally Normal                       Personal Care Assistance Level of Assistance  Bathing, Feeding, Dressing Bathing Assistance: Limited assistance Feeding assistance: Independent Dressing Assistance: Limited assistance     Functional Limitations Info  Sight, Hearing, Speech Sight Info: Adequate Hearing Info: Impaired Speech Info: Adequate    SPECIAL CARE FACTORS FREQUENCY  PT (By licensed PT), OT (By licensed OT)     PT Frequency: (5) OT Frequency:  (5)            Contractures      Additional Factors Info  Code Status, Allergies Code Status Info: (DNR ) Allergies Info: (Ciprofloxacin)           Current Medications (07/11/2017):  This is the current hospital active medication list Current Facility-Administered Medications  Medication Dose Route Frequency Provider Last Rate Last Dose  . acetaminophen (TYLENOL) tablet 650 mg  650 mg Oral Q6H PRN Oralia ManisWillis, David, MD       Or  . acetaminophen (TYLENOL) suppository 650 mg  650 mg Rectal Q6H PRN Oralia ManisWillis, David, MD      . amLODipine (NORVASC) tablet 5 mg  5 mg Oral Daily Gouru, Aruna, MD   5 mg at 07/11/17 1458  . ceFEPIme (MAXIPIME) 2 g in sodium chloride 0.9 % 100 mL IVPB  2 g Intravenous Q24H Oralia ManisWillis, David, MD 200 mL/hr at 07/11/17 1815 2 g at 07/11/17 1815  . heparin injection 5,000 Units  5,000 Units Subcutaneous Q8H Oralia ManisWillis, David, MD   5,000 Units at 07/11/17 1434  . levothyroxine (SYNTHROID, LEVOTHROID) tablet 88 mcg  88 mcg Oral QAC breakfast Oralia ManisWillis, David, MD   88 mcg at 07/11/17 [id number]  . ondansetron (ZOFRAN) injection 4 mg  4 mg Intravenous Q6H PRN Oralia ManisWillis, David, MD      . pantoprazole (PROTONIX) EC tablet 40 mg  40 mg Oral Daily Oralia ManisWillis, David, MD   40 mg at 07/11/17 [id number]     Discharge Medications: Please see discharge summary for a  list of discharge medications.  Relevant Imaging Results:  Relevant Lab Results:   Additional Information (SSN: [id number])  Jene Huq, Darleen Crocker, LCSW

## 2017-07-11 NOTE — Clinical Social Work Note (Signed)
Clinical Social Work Assessment  Patient Details  Name: Victoria Colon MRN: [id number] Date of Birth: 1916/12/01  Date of referral:  07/11/17               Reason for consult:  Facility Placement                Permission sought to share information with:    Permission granted to share information::     Name::        Agency::     Relationship::     Contact Information:     Housing/Transportation Living arrangements for the past 2 months:  Single Family Home Source of Information:  Adult Children, Partner Patient Interpreter Needed:    Criminal Activity/Legal Involvement Pertinent to Current Situation/Hospitalization:  No - Comment as needed Significant Relationships:  Adult Children Lives with:  Adult Children Do you feel safe going back to the place where you live?  Yes Need for family participation in patient care:  Yes (Comment)  Care giving concerns: Patient lives in [place] with her 2 adult daughters Victoria Colon and Victoria Colon. Patient's daughters provide 24/7 care for patient.    Social Worker assessment / plan: Holiday representative (CSW) reviewed chart and noted that PT is recommending SNF. CSW met with patient and her 2 adult daughters Victoria Colon and Victoria Colon were at bedside. Patient was pleasantly confused and did not participate in assessment. CSW introduced self and explained role of CSW department. Per daughters they live with patient and provide 24/7 care. CSW explained that PT is recommending SNF and that Mcarthur Rossetti will have to approve it. Daughters refused SNF and stated they want to take patient home with home health. RN case manager aware of above. CSW will continue to follow and assist as needed.   Employment status:  Disabled (Comment on whether or not currently receiving Disability), Retired Nurse, adult PT Recommendations:  [place] / Referral to community resources:  Other (Comment Required)(Patient's daughters refused  SNF and prefer home health. )  Patient/Family's Response to care: Patient's daughters refused SNF.   Patient/Family's Understanding of and Emotional Response to Diagnosis, Current Treatment, and Prognosis: Patient's daughters were very pleasant and thanked CSW for assistance.   Emotional Assessment Appearance:  Appears stated age Attitude/Demeanor/Rapport:  Unable to Assess Affect (typically observed):  Unable to Assess Orientation:  Oriented to Self, Oriented to Place, Fluctuating Orientation (Suspected and/or reported Sundowners) Alcohol / Substance use:  Not Applicable Psych involvement (Current and /or in the community):  No (Comment)  Discharge Needs  Concerns to be addressed:  Discharge Planning Concerns Readmission within the last 30 days:  No Current discharge risk:  Dependent with Mobility, Chronically ill Barriers to Discharge:  Continued Medical Work up   [employer], Veronia Beets, LCSW 07/11/2017, 6:32 PM

## 2017-07-11 NOTE — Progress Notes (Signed)
[facility] [place] [facility]   PATIENT NAME: Victoria Colon    MR#:  [id number]  DATE OF BIRTH:  [birth year]  SUBJECTIVE:  CHIEF COMPLAINT:  Sob is better but still coughing and feeling weak 2 daughters at bedside and prefers taking their mom home  REVIEW OF SYSTEMS:  CONSTITUTIONAL: No fever, fatigue or weakness.  EYES: No blurred or double vision.  EARS, NOSE, AND THROAT: No tinnitus or ear pain.  RESPIRATORY: Reporting intermittent episodes of cough, shortness of breath with exertion, no wheezing or hemoptysis.  CARDIOVASCULAR: No chest pain, orthopnea, edema.  GASTROINTESTINAL: No nausea, vomiting, diarrhea or abdominal pain.  GENITOURINARY: No dysuria, hematuria.  ENDOCRINE: No polyuria, nocturia,  HEMATOLOGY: No anemia, easy bruising or bleeding SKIN: No rash or lesion. MUSCULOSKELETAL: No joint pain or arthritis.   NEUROLOGIC: No tingling, numbness, weakness.  PSYCHIATRY: No anxiety or depression.   DRUG ALLERGIES:   Allergies  Allergen Reactions  . Ciprofloxacin     Heart dysrhythmia    VITALS:  Blood pressure (!) 152/56, pulse 63, temperature 98.2 F (36.8 C), temperature source Oral, resp. rate 18, height 5' (1.524 m), weight 80.3 kg (177 lb), SpO2 99 %.  PHYSICAL EXAMINATION:  GENERAL:  84 y.o.-year-old patient lying in the bed with no acute distress.  EYES: Pupils equal, round, reactive to light and accommodation. No scleral icterus. Extraocular muscles intact.  HEENT: Head atraumatic, normocephalic. Oropharynx and nasopharynx clear.  NECK:  Supple, no jugular venous distention. No thyroid enlargement, no tenderness.  LUNGS: Moderate breath sounds bilaterally, no wheezing, rales,rhonchi or crepitation. No use of accessory muscles of respiration.  CARDIOVASCULAR: S1, S2 normal. No murmurs, rubs, or gallops.  ABDOMEN: Soft, nontender, nondistended. Bowel sounds present. No organomegaly or mass.  EXTREMITIES: No pedal edema,  cyanosis, or clubbing.  NEUROLOGIC: Cranial nerves II through XII are intact. Muscle strength 5/5 in all extremities. Sensation intact. Gait not checked.  PSYCHIATRIC: The patient is alert and oriented x 3.  SKIN: No obvious rash, lesion, or ulcer.    LABORATORY PANEL:   CBC Recent Labs  Lab 07/11/17 0444  WBC 11.3*  HGB 10.2*  HCT 30.9*  PLT 131*   ------------------------------------------------------------------------------------------------------------------  Chemistries  Recent Labs  Lab 07/08/17 2246  07/11/17 0444  NA 137   < > 139  K 4.4   < > 4.5  CL 101   < > 107  CO2 25   < > 25  GLUCOSE 186*   < > 118*  BUN 46*   < > 62*  CREATININE 1.92*   < > 2.19*  CALCIUM 8.3*   < > 7.7*  AST 61*  --   --   ALT 45  --   --   ALKPHOS 172*  --   --   BILITOT 1.5*  --   --    < > = values in this interval not displayed.   ------------------------------------------------------------------------------------------------------------------  Cardiac Enzymes Recent Labs  Lab 07/08/17 2246  TROPONINI 0.11*   ------------------------------------------------------------------------------------------------------------------  RADIOLOGY:  Dg Chest Port 1 View  Result Date: 07/11/2017 CLINICAL DATA:  Acute onset of shortness of breath. EXAM: PORTABLE CHEST 1 VIEW COMPARISON:  Chest radiograph performed 07/08/2017 FINDINGS: There is mild elevation of the right hemidiaphragm. Bibasilar airspace opacities may reflect pulmonary edema or possibly pneumonia. Vascular congestion is noted. Peribronchial thickening is seen. A small right pleural effusion is noted. No pneumothorax is seen. The cardiomediastinal silhouette is borderline enlarged. Bilateral pacemakers are noted, with  leads ending overlying the right atrium and right ventricle. No acute osseous abnormalities are seen. IMPRESSION: 1. Mild elevation of the right hemidiaphragm. Bibasilar airspace opacities may reflect pulmonary  edema or possibly pneumonia. 2. Vascular congestion and borderline cardiomegaly. 3. Small right pleural effusion noted. Peribronchial thickening seen. Electronically Signed   By: Roanna Raider M.D.   On: 07/11/2017 06:39    EKG:   Orders placed or performed in visit on 06/22/06  . EKG 12-Lead    ASSESSMENT AND PLAN:      #Acute hypoxic respiratory failure secondary to pneumonia Clinically improving  cxr  Off BiPAP, currently on oxygen via nasal cannula Continue IV antibiotics and bronchodilator treatment  #Severe sepsis (HCC) -source of sepsis is pneumonia ,blood cultures are positive with Salmonella in both aerobic and anaerobic bottles IV antibiotics cefepime will be continued while sensitivities are pending MRSA PCR negative we will discontinue vancomycin  lactic acid mildly elevated trend lactate until within normal limits IV fluids given Leukocytosis is improving WBC 14.9-11.3  #Hyperkalemia Kayexalate and repeat BMP with potassium 4.5  #  CAP (community acquired pneumonia) - IV antibiotics cefepime and vancomycin, PRN supportive treatment Echo done -60 to 65% ejection fraction, no regional wall motion abnormalities.  #  Acute renal failure superimposed on stage 3 chronic kidney disease (HCC) -IV fluids as above, avoid nephrotoxins and monitor for improvement Creatinine 1.92-2.57, 2.19  might be her baseline, decreased urine output -Gentle hydration with IV fluids provided   # Atrial fibrillation, chronic (HCC) -continue rate controlling medications  #  HTN (hypertension) -Home medication Cozaar is on hold  2/2  to acute renal failure Add amlodipine    #OSA (obstructive sleep apnea) - continue CPAP nightly  #  Hypothyroidism -continue Synthroid    Generalized weakness physical therapy - snf Patient, 2 daughters prefers taking her home with home health Discussed with case management   All the records are reviewed and case discussed with Care  Management/Social Workerr. Management plans discussed with the patient, family and they are in agreement.  CODE STATUS: DNR  TOTAL TIME TAKING CARE OF THIS PATIENT: 35 minutes.   POSSIBLE D/C IN  1 DAYS, DEPENDING ON CLINICAL CONDITION.  Note: This dictation was prepared with Dragon dictation along with smaller phrase technology. Any transcriptional errors that result from this process are unintentional.   Ramonita Lab M.D on 07/11/2017 at 2:09 PM  Between 7am to 6pm - Pager - [phone] After 6pm go to [email or web address] - password [email or web address] [facility]  [facility]  Office  [phone]  CC: Primary care physician; Barbette Reichmann, MD

## 2017-07-11 NOTE — Care Management (Addendum)
Spoke the daughters at the bedside. Would like to take their mother home instead of skilled nursing facility.  D[facility] Health [facility]. Left voice mail for Delila Spenceory, [facility] representative, Will need EMS transport. Gwenette GreetBrenda S Elveta Rape RN MSN CCM Care Management  [phone]

## 2017-07-12 LAB — BASIC METABOLIC PANEL
Anion gap: 7 (ref 5–15)
BUN: 60 mg/dL — ABNORMAL HIGH (ref 6–20)
CO2: 26 mmol/L (ref 22–32)
Calcium: 8.1 mg/dL — ABNORMAL LOW (ref 8.9–10.3)
Chloride: 107 mmol/L (ref 101–111)
Creatinine, Ser: 1.87 mg/dL — ABNORMAL HIGH (ref 0.44–1.00)
GFR calc Af Amer: 24 mL/min — ABNORMAL LOW (ref >60)
GFR calc non Af Amer: 21 mL/min — ABNORMAL LOW (ref >60)
Glucose, Bld: 116 mg/dL — ABNORMAL HIGH (ref 65–99)
POTASSIUM: 3.8 mmol/L (ref 3.5–5.1)
Sodium: 140 mmol/L (ref 135–145)

## 2017-07-12 LAB — CBC
HEMATOCRIT: 30.7 % — AB (ref 35.0–47.0)
Hemoglobin: 10.3 g/dL — ABNORMAL LOW (ref 12.0–16.0)
MCH: 29.6 pg (ref 26.0–34.0)
MCHC: 33.6 g/dL (ref 32.0–36.0)
MCV: 88.1 fL (ref 80.0–100.0)
Platelets: 132 10*3/uL — ABNORMAL LOW (ref 150–440)
RBC: 3.48 MIL/uL — AB (ref 3.80–5.20)
RDW: 15.3 % — ABNORMAL HIGH (ref 11.5–14.5)
WBC: 10.9 10*3/uL (ref 3.6–11.0)

## 2017-07-12 MED ORDER — AMOXICILLIN-POT CLAVULANATE 875-125 MG PO TABS: 1.0000 | ORAL_TABLET | Freq: Two times a day (BID) | ORAL | 0 refills | Status: AC

## 2017-07-12 MED ORDER — ALBUTEROL SULFATE HFA 108 (90 BASE) MCG/ACT IN AERS
2.0000 | INHALATION_SPRAY | RESPIRATORY_TRACT | 1 refills | Status: AC | PRN
Start: 2017-07-12 — End: ?

## 2017-07-12 NOTE — Discharge Instructions (Signed)
Continue 2 L of oxygen via nasal cannula continuous Home health Follow-up with primary care physician in a week

## 2017-07-12 NOTE — Care Management Note (Signed)
Case Management Note  Patient Details  Name: [name] MRN: [id number] Date of Birth: 06-24-1916  Subjective/Objective: [facility]. Patient will need RN and PT.  Faxed sats, demographics and order to [facility] for continuous O2. Spoke with daughter, Darl Pikessusan, regarding discharge plan. She is in agreement. EMS form completed and placed on chart. Primary nurse updated.               Action/Plan:   Expected Discharge Date:  07/12/17               Expected Discharge Plan:  Home w Home Health Services  In-House Referral:     Discharge planning Services  CM Consult  Post Acute Care Choice:  Home Health Choice offered to:  Adult Children  DME Arranged:  Oxygen [facility]  HH Arranged:  PT, RN HH Agency:  [facility]  Status of Service:  Completed, signed off  If discussed at Long Length of Stay Meetings, dates discussed:    Additional Comments:  Marily MemosLisa M Tinesha Siegrist, RN 07/12/2017, 10:36 AM

## 2017-07-12 NOTE — Progress Notes (Signed)
Pt for discharge home. Alert/ no resp distress. 02 in use at [facility]l [place]. Pt on 02 at night. Now will be on cont. Instructions discussed with dtr. meds discussed / diet / activity and f/u. Verbalized understanding. Home via ems at this time

## 2017-07-12 NOTE — Discharge Summary (Signed)
[facility] NAME: Victoria Colon    MR#:  [id number]  DATE OF BIRTH:  1916-05-08  DATE OF ADMISSION:  07/08/2017 ADMITTING PHYSICIAN: Nicholes Mango, MD  DATE OF DISCHARGE:  07/12/17 PRIMARY CARE PHYSICIAN: Tracie Harrier, MD    ADMISSION DIAGNOSIS:  Acute respiratory failure with hypoxia ([place]) [J96.01] Sepsis, due to unspecified organism ([place]) [A41.9] Acute respiratory failure ([place]) [J96.00]  DISCHARGE DIAGNOSIS:  Principal Problem:   Severe sepsis ([place]) Active Problems:   CAP (community acquired pneumonia)   OSA (obstructive sleep apnea)   Atrial fibrillation, chronic (HCC)   HTN (hypertension)   Hypothyroidism   Acute renal failure superimposed on stage 3 chronic kidney disease ([place])   Acute respiratory failure ([place])   SECONDARY DIAGNOSIS:   Past Medical History:  Diagnosis Date  . Acquired hypothyroidism   . Benign essential hypertension   . Bilateral leg edema   . Cardiac pacemaker   . Chronic a-fib ([place])   . CKD (chronic kidney disease)   . Complete heart block ([place])   . HOH (hard of hearing)   . Hyperlipidemia   . OSA (obstructive sleep apnea)   . PVD (peripheral vascular disease) [facility])     HOSPITAL COURSE:  Victoria Colon  is a 84 y.o. female who presents with fever, increased cough, respiratory distress.  Here in the ED the patient required BiPAP for increased work of breathing.  She was found to have pneumonia on imaging, and met sepsis criteria.  Hospitalist were called for admission.  Patient does not contribute information to HPI.  Family at bedside states that she has been having increased cough the past couple of days, nonproductive.  However, they state that she always has a cough and so they did not think much of this.  Today she was more somnolent, and febrile and so they brought her to the ED.   #Acute hypoxic respiratory failure secondary to pneumonia Clinically improving  cxr-  rpt-pneumonia is resolving Off BiPAP, currently on oxygen via nasal cannula Change IV to p.o. antibiotics   #Severe sepsis (HCC) -source of sepsis is pneumonia ,blood cultures are positive with Salmonella in both aerobic and anaerobic bottles IV antibiotics cefepime will be continued while sensitivities are pending MRSA PCR negative we will discontinue vancomycin  lactic acid mildly elevated trend lactate until within normal limits IV fluids given Leukocytosis is improving WBC 14.9-11.3  #Hyperkalemia Kayexalate and repeat BMP with potassium 4.5  #CAP (community acquired pneumonia) - IV antibiotics cefepime and vancomycin, PRN supportive treatment Echo done -60 to 65% ejection fraction, no regional wall motion abnormalities. Resume torsemide and Zaroxolyn for mild CHF which were held  #Acute renal failure superimposed on stage 3 chronic kidney disease ([place]) -IV fluids given as above, avoid nephrotoxins and monitor for improvement Creatinine 1.92-2.57, 2.19 -1.87 might be her baseline, decreased urine output Resume home medication torsemide and Zaroxolyn  #Atrial fibrillation, chronic (HCC) -continue rate controlling medications  #HTN (hypertension) -Home medication Cozaar is on hold  2/2  to acute renal failure will be resumed as patient's creatinine seems to be at baseline  #OSA (obstructive sleep apnea) - continue CPAP nightly  #Hypothyroidism -continue Synthroid  Generalized weakness physical therapy - snf Patient, 2 daughters prefers taking her home with home health Discussed with case management  Qualified for 2 L of oxygen continuous  DISCHARGE CONDITIONS:   stable  CONSULTS OBTAINED:     PROCEDURES  None   DRUG ALLERGIES:  Allergies  Allergen Reactions  . Ciprofloxacin     Heart dysrhythmia    DISCHARGE MEDICATIONS:   Allergies as of 07/12/2017      Reactions   Ciprofloxacin    Heart dysrhythmia      Medication List    TAKE  these medications   albuterol 108 (90 Base) MCG/ACT inhaler Commonly known as:  PROVENTIL HFA;VENTOLIN HFA Inhale 2 puffs into the lungs every 4 (four) hours as needed for wheezing or shortness of breath.   amoxicillin-clavulanate 875-125 MG tablet Commonly known as:  AUGMENTIN Take 1 tablet by mouth 2 (two) times daily for 10 doses.   docusate sodium 100 MG capsule Commonly known as:  COLACE Take 100 mg by mouth 2 (two) times daily as needed for mild constipation.   ferrous sulfate 325 (65 FE) MG tablet Take 325 mg by mouth daily with breakfast.   levothyroxine 88 MCG tablet Commonly known as:  SYNTHROID, LEVOTHROID Take 88 mcg by mouth daily before breakfast.   losartan 100 MG tablet Commonly known as:  COZAAR Take 100 mg by mouth daily.   metolazone 2.5 MG tablet Commonly known as:  ZAROXOLYN Take 2.5 mg by mouth once a week.   omeprazole 20 MG capsule Commonly known as:  PRILOSEC Take 20 mg by mouth daily.   pravastatin 20 MG tablet Commonly known as:  PRAVACHOL Take 20 mg by mouth every evening.   torsemide 10 MG tablet Commonly known as:  DEMADEX Take 10 mg by mouth daily.   vitamin B-12 1000 MCG tablet Commonly known as:  CYANOCOBALAMIN Take 1,000 mcg by mouth daily.            Durable Medical Equipment  (From admission, onward)        Start     Ordered   07/12/17 0956  For home use only DME oxygen  Once    Question Answer Comment  Mode or (Route) Nasal cannula   Liters per Minute 2   Frequency Continuous (stationary and portable oxygen unit needed)   Oxygen conserving device Yes   Oxygen delivery system Gas      07/12/17 0955       DISCHARGE INSTRUCTIONS:   Continue 2 L of oxygen via nasal cannula continuous Home health Follow-up with primary care physician in a week   DIET:  Cardiac diet  DISCHARGE CONDITION:  Stable  ACTIVITY:  Activity as tolerated  OXYGEN:  Home Oxygen: Yes.     Oxygen Delivery: 2 liters/min via  Patient connected to nasal cannula oxygen  DISCHARGE LOCATION:  home   If you experience worsening of your admission symptoms, develop shortness of breath, life threatening emergency, suicidal or homicidal thoughts you must seek medical attention immediately by calling 911 or calling your MD immediately  if symptoms less severe.  You Must read complete instructions/literature along with all the possible adverse reactions/side effects for all the Medicines you take and that have been prescribed to you. Take any new Medicines after you have completely understood and accpet all the possible adverse reactions/side effects.   Please note  You were cared for by a hospitalist during your hospital stay. If you have any questions about your discharge medications or the care you received while you were in the hospital after you are discharged, you can call the unit and asked to speak with the hospitalist on call if the hospitalist that took care of you is not available. Once you are discharged, your primary care physician will handle  any further medical issues. Please note that NO REFILLS for any discharge medications will be authorized once you are discharged, as it is imperative that you return to your primary care physician (or establish a relationship with a primary care physician if you do not have one) for your aftercare needs so that they can reassess your need for medications and monitor your lab values.     Today  Chief Complaint  Patient presents with  . Respiratory Distress    Patient is doing fine.  More awake and alert and denies any chest pain or shortness of breath.  Daughter at bedside wants to take mom home, ROS:  CONSTITUTIONAL: Denies fevers, chills. Denies any fatigue, weakness.  EYES: Denies blurry vision, double vision, eye pain. EARS, NOSE, THROAT: Denies tinnitus, ear pain, hearing loss. RESPIRATORY: Denies cough, wheeze, shortness of breath.  CARDIOVASCULAR: Denies chest  pain, palpitations, edema.  GASTROINTESTINAL: Denies nausea, vomiting, diarrhea, abdominal pain. Denies bright red blood per rectum. GENITOURINARY: Denies dysuria, hematuria. ENDOCRINE: Denies nocturia or thyroid problems. HEMATOLOGIC AND LYMPHATIC: Denies easy bruising or bleeding. SKIN: Denies rash or lesion. MUSCULOSKELETAL: Denies pain in neck, back, shoulder, knees, hips or arthritic symptoms.  NEUROLOGIC: Denies paralysis, paresthesias.  PSYCHIATRIC: Denies anxiety or depressive symptoms.   VITAL SIGNS:  Blood pressure (!) 156/54, pulse 60, temperature 98.4 F (36.9 C), temperature source Oral, resp. rate 18, height 5' (1.524 m), weight 83.3 kg (183 lb 11.2 oz), SpO2 97 %.  I/O:    Intake/Output Summary (Last 24 hours) at 07/12/2017 1009 Last data filed at 07/12/2017 0500 Gross per 24 hour  Intake 120 ml  Output 1400 ml  Net -1280 ml    PHYSICAL EXAMINATION:  GENERAL:  84 y.o.-year-old patient lying in the bed with no acute distress.  EYES: Pupils equal, round, reactive to light and accommodation. No scleral icterus. Extraocular muscles intact.  HEENT: Head atraumatic, normocephalic. Oropharynx and nasopharynx clear.  NECK:  Supple, no jugular venous distention. No thyroid enlargement, no tenderness.  LUNGS: mod breath sounds bilaterally, no wheezing, rales,rhonchi or crepitation. No use of accessory muscles of respiration.  CARDIOVASCULAR: S1, S2 normal. No murmurs, rubs, or gallops.  ABDOMEN: Soft, non-tender, non-distended. Bowel sounds present. No organomegaly or mass.  EXTREMITIES: No pedal edema, cyanosis, or clubbing.  NEUROLOGIC: Cranial nerves II through XII are intact. Muscle strength at baseline in all extremities. Sensation intact. Gait not checked.  PSYCHIATRIC: The patient is alert and oriented x 3.  SKIN: No obvious rash, lesion, or ulcer.   DATA REVIEW:   CBC Recent Labs  Lab 07/12/17 0523  WBC 10.9  HGB 10.3*  HCT 30.7*  PLT 132*    Chemistries   Recent Labs  Lab 07/08/17 2246  07/12/17 0523  NA 137   < > 140  K 4.4   < > 3.8  CL 101   < > 107  CO2 25   < > 26  GLUCOSE 186*   < > 116*  BUN 46*   < > 60*  CREATININE 1.92*   < > 1.87*  CALCIUM 8.3*   < > 8.1*  AST 61*  --   --   ALT 45  --   --   ALKPHOS 172*  --   --   BILITOT 1.5*  --   --    < > = values in this interval not displayed.    Cardiac Enzymes Recent Labs  Lab 07/08/17 2246  TROPONINI 0.11*    Microbiology Results  Results for orders placed or performed during the hospital encounter of 07/08/17  Blood Culture (routine x 2)     Status: Abnormal (Preliminary result)   Collection Time: 07/08/17 10:47 PM  Result Value Ref Range Status   Specimen Description   Final    BLOOD RIGHT ANTECUBITAL Performed at [facility], [address] [place], [place] [address]    Special Requests   Final    BOTTLES DRAWN AEROBIC AND ANAEROBIC Blood Culture results may not be optimal due to an excessive volume of blood received in culture bottles Performed at [facility], [address] [place], [place] [address]    Culture  Setup Time   Final    GRAM NEGATIVE RODS IN BOTH AEROBIC AND ANAEROBIC BOTTLES CRITICAL RESULT CALLED TO, READ BACK BY AND VERIFIED WITH: [place] AT 1132 07/09/17 SDR    Culture (A)  Final    SALMONELLA SPECIES RESULT CALLED TO, READ BACK BY AND VERIFIED WITH: Wynona Dove AT 1052 07/10/17 BY L BENFIELD CONCERNING GROWTH ON CULTURE HEALTH DEPARTMENT NOTIFIED Referred to [facility] in [place], [place] for Serotyping. Performed at [facility], [place] [address], [place], [place] [address]    Report Status PENDING  Incomplete  Blood Culture (routine x 2)     Status: Abnormal   Collection Time: 07/08/17 10:47 PM  Result Value Ref Range Status   Specimen Description   Final    BLOOD BLOOD LEFT ARM Performed at [facility], [address], [place], [place] [address]    Special  Requests   Final    BOTTLES DRAWN AEROBIC AND ANAEROBIC Blood Culture adequate volume Performed at [facility], [address] [place], [place] [address]    Culture  Setup Time   Final    GRAM NEGATIVE RODS IN BOTH AEROBIC AND ANAEROBIC BOTTLES CRITICAL VALUE NOTED.  VALUE IS CONSISTENT WITH PREVIOUSLY REPORTED AND CALLED VALUE. Performed at [facility], [address] [place], [place] [address]    Culture (A)  Final    SALMONELLA SPECIES RESULT CALLED TO, READ BACK BY AND VERIFIED WITH: M PETTON,RN AT 1052 07/10/17 BY L BENFIELD CONCERNING GROWTH ON CULTURE SUSCEPTIBILITIES PERFORMED ON PREVIOUS CULTURE WITHIN THE LAST 5 DAYS. Performed at [facility], [place] [address] [place], [place] [address]    Report Status 07/11/2017 FINAL  Final  Blood Culture ID Panel (Reflexed)     Status: Abnormal   Collection Time: 07/08/17 10:47 PM  Result Value Ref Range Status   Enterococcus species NOT DETECTED NOT DETECTED Final   Listeria monocytogenes NOT DETECTED NOT DETECTED Final   Staphylococcus species NOT DETECTED NOT DETECTED Final   Staphylococcus aureus NOT DETECTED NOT DETECTED Final   Streptococcus species NOT DETECTED NOT DETECTED Final   Streptococcus agalactiae NOT DETECTED NOT DETECTED Final   Streptococcus pneumoniae NOT DETECTED NOT DETECTED Final   Streptococcus pyogenes NOT DETECTED NOT DETECTED Final   Acinetobacter baumannii NOT DETECTED NOT DETECTED Final   Enterobacteriaceae species DETECTED (A) NOT DETECTED Final    Comment: Enterobacteriaceae represent a large family of gram negative bacteria, not a single organism. Refer to culture for further identification. NATE COOKSON AT 1132 07/09/17 SDR    Enterobacter cloacae complex NOT DETECTED NOT DETECTED Final   Escherichia coli NOT DETECTED NOT DETECTED Final   Klebsiella oxytoca NOT DETECTED NOT DETECTED Final   Klebsiella pneumoniae NOT DETECTED NOT DETECTED Final   Proteus species NOT  DETECTED NOT DETECTED Final   Serratia marcescens NOT DETECTED NOT DETECTED Final   Carbapenem resistance NOT DETECTED NOT DETECTED Final   Haemophilus influenzae NOT DETECTED NOT DETECTED Final   Neisseria meningitidis NOT DETECTED NOT DETECTED Final   Pseudomonas aeruginosa NOT DETECTED NOT DETECTED Final   Candida albicans NOT DETECTED NOT DETECTED Final   Candida glabrata NOT DETECTED NOT DETECTED Final   Candida krusei NOT DETECTED NOT DETECTED Final   Candida parapsilosis NOT DETECTED NOT DETECTED Final   Candida tropicalis NOT DETECTED NOT DETECTED Final    Comment: Performed at [facility], [address] [place], [place] [address]  Urine culture     Status: None   Collection Time: 07/09/17 12:06 AM  Result Value Ref Range Status   Specimen Description   Final    URINE, CATHETERIZED Performed at [facility], [address] [place], [place] [address]    Special Requests   Final    NONE Performed at [facility], [address] [place], [place] [address]    Culture   Final    NO GROWTH Performed at [facility], [place] [address], [place], [place] [address]    Report Status 07/10/2017 FINAL  Final  MRSA PCR Screening     Status: None   Collection Time: 07/09/17  3:39 PM  Result Value Ref Range Status   MRSA by PCR NEGATIVE NEGATIVE Final    Comment:        The GeneXpert MRSA Assay (FDA approved for NASAL specimens only), is one component of a comprehensive MRSA colonization surveillance program. It is not intended to diagnose MRSA infection nor to guide or monitor treatment for MRSA infections. Performed at [facility], [address] [place], [place] [address]     RADIOLOGY:  Dg Chest 2 View  Result Date: 07/11/2017 CLINICAL DATA:  Atrial fibrillation, respiratory failure with shortness of breath, community-acquired pneumonia, acute on chronic renal failure. EXAM: CHEST - 2 VIEW COMPARISON:  Portable chest x-ray  of July 11, 2017 at 6:22 a.m. FINDINGS: There remains elevation of the right hemidiaphragm with resultant volume loss on the right. The left lung is better inflated. The interstitial markings remain increased bilaterally. The cardiac silhouette remains enlarged. The pulmonary vascularity is slightly less prominent but remains engorged. There is dense calcification in the wall of the thoracic aorta. The ICD is are in stable position. The bony thorax exhibits no acute abnormality. IMPRESSION: Persistent low-grade CHF. Chronic elevation of the right hemidiaphragm. No alveolar pneumonia. Thoracic aortic atherosclerosis. Electronically Signed   By: David  Martinique M.D.   On: 07/11/2017 14:59   Dg Chest Port 1 View  Result Date: 07/11/2017 CLINICAL DATA:  Acute onset of shortness of breath. EXAM: PORTABLE CHEST 1 VIEW COMPARISON:  Chest radiograph performed 07/08/2017 FINDINGS: There is mild elevation of the right hemidiaphragm. Bibasilar airspace opacities may reflect pulmonary edema or possibly pneumonia. Vascular congestion is noted. Peribronchial thickening is seen. A small right pleural effusion is noted. No pneumothorax is seen. The cardiomediastinal silhouette is borderline enlarged. Bilateral pacemakers are noted, with leads ending overlying the right atrium and right ventricle. No acute osseous abnormalities are seen. IMPRESSION: 1. Mild elevation of the right hemidiaphragm. Bibasilar airspace opacities may reflect pulmonary edema or possibly pneumonia. 2. Vascular congestion and borderline cardiomegaly. 3. Small right pleural effusion noted. Peribronchial thickening seen. Electronically Signed   By: Garald Balding M.D.   On: 07/11/2017 06:39   Dg Chest [facility]  1 View  Result Date: 07/08/2017 CLINICAL DATA:  Difficulty breathing EXAM: PORTABLE CHEST 1 VIEW FINDINGS: LEFT and RIGHT-sided pacemakers with multiple continuous leads overlies stable enlarged cardiac silhouette. There is increase in central venous  congestion. Bilateral pleural effusions. No pneumothorax. IMPRESSION: Cardiomegaly, basilar atelectasis, and bilateral effusions. Electronically Signed   By: Suzy Bouchard M.D.   On: 07/08/2017 22:45    EKG:   Orders placed or performed in visit on 06/22/06  . EKG 12-Lead      Management plans discussed with the patient, family and they are in agreement.  CODE STATUS:     Code Status Orders  (From admission, onward)        Start     Ordered   07/09/17 0757  Do not attempt resuscitation (DNR)  Continuous    Question Answer Comment  In the event of cardiac or respiratory ARREST Do not call a "code blue"   In the event of cardiac or respiratory ARREST Do not perform Intubation, CPR, defibrillation or ACLS   In the event of cardiac or respiratory ARREST Use medication by any route, position, wound care, and other measures to relive pain and suffering. May use oxygen, suction and manual treatment of airway obstruction as needed for comfort.      07/09/17 0756    Code Status History    Date Active Date Inactive Code Status Order ID Comments User Context   07/08/2017 2307 07/09/2017 0756 DNR [id number]  Merlyn Lot, MD ED    Advance Directive Documentation     Most Recent Value  Type of Advance Directive  Healthcare Power of [place], Living will  Pre-existing out of facility DNR order (yellow form or pink MOST form)  -  "MOST" Form in Place?  -      TOTAL TIME TAKING CARE OF THIS PATIENT: 45  minutes.   Note: This dictation was prepared with Dragon dictation along with smaller phrase technology. Any transcriptional errors that result from this process are unintentional.   '@[facility]' @  on 07/12/2017 at 10:09 AM  Between 7am to 6pm - Pager - [phone]  After 6pm go to [email or web address] - password [email or web address] [facility]  Office  [phone]  CC: Primary care physician; Tracie Harrier, MD

## 2017-07-12 NOTE — Care Management Important Message (Signed)
Important Message  Patient Details  Name: Marianne SofiaMillicent Nied MRN: [id number] Date of Birth: Mar 24, 1916   Medicare Important Message Given:  Yes    Olegario MessierKathy A Marise Knapper 07/12/2017, 10:42 AM

## 2017-07-12 NOTE — Progress Notes (Signed)
SATURATION QUALIFICATIONS: (This note is used to comply with regulatory documentation for home oxygen)  Patient Saturations on Room Air at Rest = 83%  Patient Saturations on Room Air while Ambulating = n/a%  Patient Saturations on 0Liters of oxygen while Ambulating =0%  Please briefly explain why patient needs home oxygen: resting sats 83 % back up to 95 % on 2l [place] at rest

## 2017-07-12 NOTE — Plan of Care (Signed)
  Problem: Education: Goal: Knowledge of General Education information will improve Outcome: Progressing   Problem: Health Behavior/Discharge Planning: Goal: Ability to manage health-related needs will improve Outcome: Progressing   Problem: Clinical Measurements: Goal: Ability to maintain clinical measurements within normal limits will improve Outcome: Progressing Goal: Will remain free from infection Outcome: Progressing Goal: Diagnostic test results will improve Outcome: Progressing Goal: Respiratory complications will improve Outcome: Progressing Goal: Cardiovascular complication will be avoided Outcome: Progressing   Problem: Activity: Goal: Risk for activity intolerance will decrease Outcome: Progressing   Problem: Nutrition: Goal: Adequate nutrition will be maintained Outcome: Progressing   Problem: Coping: Goal: Level of anxiety will decrease Outcome: Progressing   Problem: Elimination: Goal: Will not experience complications related to bowel motility Outcome: Progressing Goal: Will not experience complications related to urinary retention Outcome: Progressing   Problem: Pain Managment: Goal: General experience of comfort will improve Outcome: Progressing   Problem: Safety: Goal: Ability to remain free from injury will improve Outcome: Progressing   Problem: Skin Integrity: Goal: Risk for impaired skin integrity will decrease Outcome: Progressing   Problem: Respiratory: Goal: Ability to maintain adequate ventilation will improve Outcome: Progressing Goal: Ability to maintain a clear airway will improve Outcome: Progressing

## 2017-07-13 DIAGNOSIS — A021 Salmonella sepsis: Secondary | ICD-10-CM | POA: Diagnosis not present

## 2017-07-13 DIAGNOSIS — J9601 Acute respiratory failure with hypoxia: Secondary | ICD-10-CM | POA: Diagnosis not present

## 2017-07-13 DIAGNOSIS — N179 Acute kidney failure, unspecified: Secondary | ICD-10-CM | POA: Diagnosis not present

## 2017-07-13 DIAGNOSIS — I130 Hypertensive heart and chronic kidney disease with heart failure and stage 1 through stage 4 chronic kidney disease, or unspecified chronic kidney disease: Secondary | ICD-10-CM | POA: Diagnosis not present

## 2017-07-13 DIAGNOSIS — J189 Pneumonia, unspecified organism: Secondary | ICD-10-CM | POA: Diagnosis not present

## 2017-07-13 DIAGNOSIS — R6520 Severe sepsis without septic shock: Secondary | ICD-10-CM | POA: Diagnosis not present

## 2017-07-14 DIAGNOSIS — R6520 Severe sepsis without septic shock: Secondary | ICD-10-CM | POA: Diagnosis not present

## 2017-07-14 DIAGNOSIS — J189 Pneumonia, unspecified organism: Secondary | ICD-10-CM | POA: Diagnosis not present

## 2017-07-14 DIAGNOSIS — I130 Hypertensive heart and chronic kidney disease with heart failure and stage 1 through stage 4 chronic kidney disease, or unspecified chronic kidney disease: Secondary | ICD-10-CM | POA: Diagnosis not present

## 2017-07-14 DIAGNOSIS — J9601 Acute respiratory failure with hypoxia: Secondary | ICD-10-CM | POA: Diagnosis not present

## 2017-07-14 DIAGNOSIS — N179 Acute kidney failure, unspecified: Secondary | ICD-10-CM | POA: Diagnosis not present

## 2017-07-14 DIAGNOSIS — A021 Salmonella sepsis: Secondary | ICD-10-CM | POA: Diagnosis not present

## 2017-07-15 DIAGNOSIS — I130 Hypertensive heart and chronic kidney disease with heart failure and stage 1 through stage 4 chronic kidney disease, or unspecified chronic kidney disease: Secondary | ICD-10-CM | POA: Diagnosis not present

## 2017-07-15 DIAGNOSIS — J189 Pneumonia, unspecified organism: Secondary | ICD-10-CM | POA: Diagnosis not present

## 2017-07-15 DIAGNOSIS — R6520 Severe sepsis without septic shock: Secondary | ICD-10-CM | POA: Diagnosis not present

## 2017-07-15 DIAGNOSIS — N179 Acute kidney failure, unspecified: Secondary | ICD-10-CM | POA: Diagnosis not present

## 2017-07-15 DIAGNOSIS — J9601 Acute respiratory failure with hypoxia: Secondary | ICD-10-CM | POA: Diagnosis not present

## 2017-07-15 DIAGNOSIS — A021 Salmonella sepsis: Secondary | ICD-10-CM | POA: Diagnosis not present

## 2017-07-18 DIAGNOSIS — A021 Salmonella sepsis: Secondary | ICD-10-CM | POA: Diagnosis not present

## 2017-07-18 DIAGNOSIS — N179 Acute kidney failure, unspecified: Secondary | ICD-10-CM | POA: Diagnosis not present

## 2017-07-18 DIAGNOSIS — I130 Hypertensive heart and chronic kidney disease with heart failure and stage 1 through stage 4 chronic kidney disease, or unspecified chronic kidney disease: Secondary | ICD-10-CM | POA: Diagnosis not present

## 2017-07-18 DIAGNOSIS — J9601 Acute respiratory failure with hypoxia: Secondary | ICD-10-CM | POA: Diagnosis not present

## 2017-07-18 DIAGNOSIS — R6520 Severe sepsis without septic shock: Secondary | ICD-10-CM | POA: Diagnosis not present

## 2017-07-18 DIAGNOSIS — J189 Pneumonia, unspecified organism: Secondary | ICD-10-CM | POA: Diagnosis not present

## 2017-07-19 DIAGNOSIS — A021 Salmonella sepsis: Secondary | ICD-10-CM | POA: Diagnosis not present

## 2017-07-19 DIAGNOSIS — N179 Acute kidney failure, unspecified: Secondary | ICD-10-CM | POA: Diagnosis not present

## 2017-07-19 DIAGNOSIS — J449 Chronic obstructive pulmonary disease, unspecified: Secondary | ICD-10-CM | POA: Diagnosis not present

## 2017-07-19 DIAGNOSIS — J189 Pneumonia, unspecified organism: Secondary | ICD-10-CM | POA: Diagnosis not present

## 2017-07-19 DIAGNOSIS — R6520 Severe sepsis without septic shock: Secondary | ICD-10-CM | POA: Diagnosis not present

## 2017-07-19 DIAGNOSIS — I130 Hypertensive heart and chronic kidney disease with heart failure and stage 1 through stage 4 chronic kidney disease, or unspecified chronic kidney disease: Secondary | ICD-10-CM | POA: Diagnosis not present

## 2017-07-19 DIAGNOSIS — J9601 Acute respiratory failure with hypoxia: Secondary | ICD-10-CM | POA: Diagnosis not present

## 2017-07-20 DIAGNOSIS — R6520 Severe sepsis without septic shock: Secondary | ICD-10-CM | POA: Diagnosis not present

## 2017-07-20 DIAGNOSIS — N179 Acute kidney failure, unspecified: Secondary | ICD-10-CM | POA: Diagnosis not present

## 2017-07-20 DIAGNOSIS — J189 Pneumonia, unspecified organism: Secondary | ICD-10-CM | POA: Diagnosis not present

## 2017-07-20 DIAGNOSIS — J9601 Acute respiratory failure with hypoxia: Secondary | ICD-10-CM | POA: Diagnosis not present

## 2017-07-20 DIAGNOSIS — A021 Salmonella sepsis: Secondary | ICD-10-CM | POA: Diagnosis not present

## 2017-07-20 DIAGNOSIS — I130 Hypertensive heart and chronic kidney disease with heart failure and stage 1 through stage 4 chronic kidney disease, or unspecified chronic kidney disease: Secondary | ICD-10-CM | POA: Diagnosis not present

## 2017-07-21 DIAGNOSIS — A021 Salmonella sepsis: Secondary | ICD-10-CM | POA: Diagnosis not present

## 2017-07-21 DIAGNOSIS — J9601 Acute respiratory failure with hypoxia: Secondary | ICD-10-CM | POA: Diagnosis not present

## 2017-07-21 DIAGNOSIS — R6520 Severe sepsis without septic shock: Secondary | ICD-10-CM | POA: Diagnosis not present

## 2017-07-21 DIAGNOSIS — N179 Acute kidney failure, unspecified: Secondary | ICD-10-CM | POA: Diagnosis not present

## 2017-07-21 DIAGNOSIS — I130 Hypertensive heart and chronic kidney disease with heart failure and stage 1 through stage 4 chronic kidney disease, or unspecified chronic kidney disease: Secondary | ICD-10-CM | POA: Diagnosis not present

## 2017-07-21 DIAGNOSIS — J189 Pneumonia, unspecified organism: Secondary | ICD-10-CM | POA: Diagnosis not present

## 2017-07-25 DIAGNOSIS — R6520 Severe sepsis without septic shock: Secondary | ICD-10-CM | POA: Diagnosis not present

## 2017-07-25 DIAGNOSIS — I130 Hypertensive heart and chronic kidney disease with heart failure and stage 1 through stage 4 chronic kidney disease, or unspecified chronic kidney disease: Secondary | ICD-10-CM | POA: Diagnosis not present

## 2017-07-25 DIAGNOSIS — J189 Pneumonia, unspecified organism: Secondary | ICD-10-CM | POA: Diagnosis not present

## 2017-07-25 DIAGNOSIS — A021 Salmonella sepsis: Secondary | ICD-10-CM | POA: Diagnosis not present

## 2017-07-25 DIAGNOSIS — I509 Heart failure, unspecified: Secondary | ICD-10-CM | POA: Diagnosis not present

## 2017-07-25 DIAGNOSIS — I442 Atrioventricular block, complete: Secondary | ICD-10-CM | POA: Diagnosis not present

## 2017-07-25 DIAGNOSIS — N179 Acute kidney failure, unspecified: Secondary | ICD-10-CM | POA: Diagnosis not present

## 2017-07-25 DIAGNOSIS — N183 Chronic kidney disease, stage 3 (moderate): Secondary | ICD-10-CM | POA: Diagnosis not present

## 2017-07-25 DIAGNOSIS — J9601 Acute respiratory failure with hypoxia: Secondary | ICD-10-CM | POA: Diagnosis not present

## 2017-07-26 DIAGNOSIS — J189 Pneumonia, unspecified organism: Secondary | ICD-10-CM | POA: Diagnosis not present

## 2017-07-26 DIAGNOSIS — R6520 Severe sepsis without septic shock: Secondary | ICD-10-CM | POA: Diagnosis not present

## 2017-07-26 DIAGNOSIS — A021 Salmonella sepsis: Secondary | ICD-10-CM | POA: Diagnosis not present

## 2017-07-26 DIAGNOSIS — I130 Hypertensive heart and chronic kidney disease with heart failure and stage 1 through stage 4 chronic kidney disease, or unspecified chronic kidney disease: Secondary | ICD-10-CM | POA: Diagnosis not present

## 2017-07-26 DIAGNOSIS — J9601 Acute respiratory failure with hypoxia: Secondary | ICD-10-CM | POA: Diagnosis not present

## 2017-07-26 DIAGNOSIS — N179 Acute kidney failure, unspecified: Secondary | ICD-10-CM | POA: Diagnosis not present

## 2017-07-27 LAB — CULTURE, BLOOD (ROUTINE X 2)

## 2017-08-20 DEATH — deceased

## 2020-03-31 IMAGING — CR DG CHEST 2V
2 series · 2 of 2 positions shown · non-contrast
Comparison: Portable chest x-ray July 11, 2017 at [DATE] a.m..

CLINICAL DATA: Atrial fibrillation, respiratory failure with
shortness of breath, community-acquired pneumonia, acute on chronic
renal failure.

EXAM:
CHEST - 2 VIEW

[chest lat]
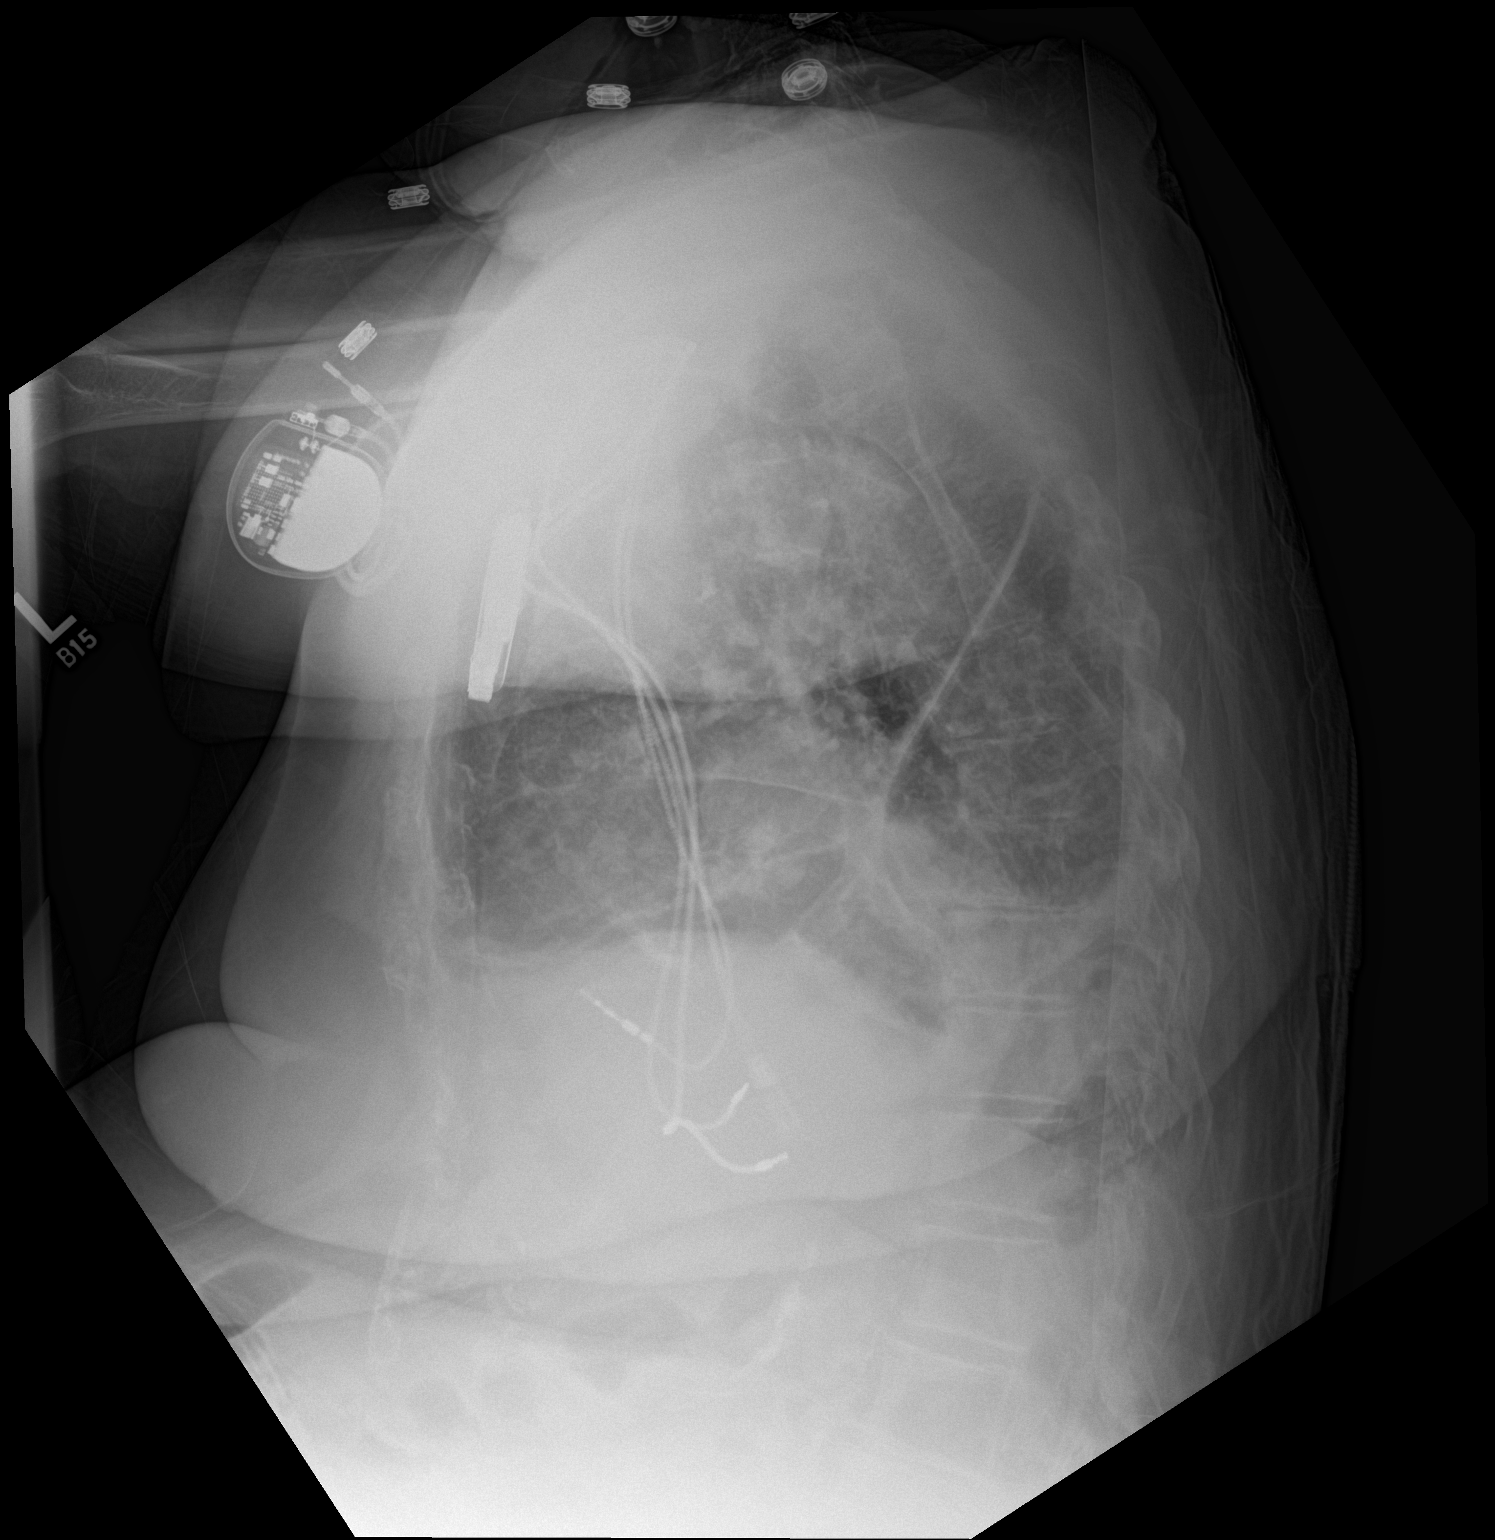

[chest ap]
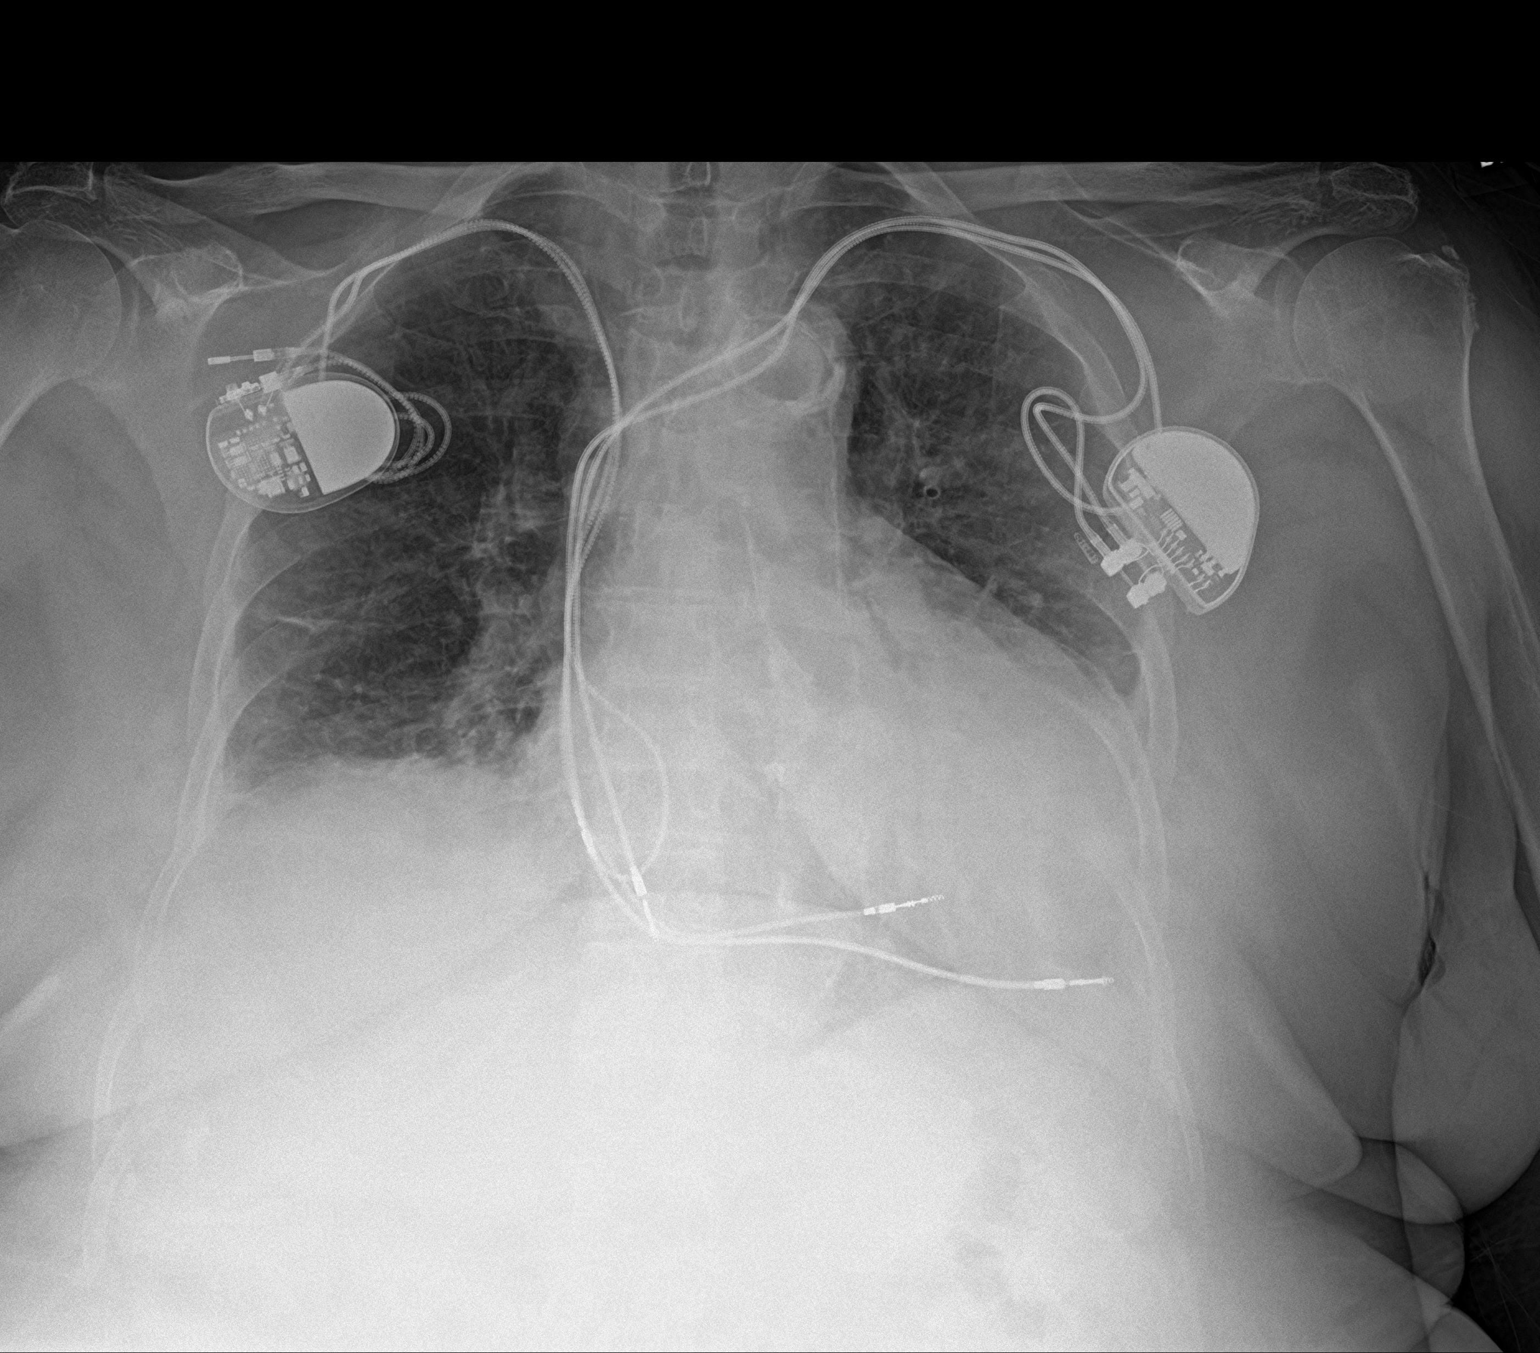

[2 of 2 positions shown; findings below may reference images not displayed]

FINDINGS: There remains elevation of the right hemidiaphragm with resultant
volume loss on the right. The left lung is better inflated. The
interstitial markings remain increased bilaterally. The cardiac
silhouette remains enlarged. The pulmonary vascularity is slightly
less prominent but remains engorged. There is dense calcification in
the wall of the thoracic aorta. The ICD is are in stable position.
The bony thorax exhibits no acute abnormality.
IMPRESSION: Persistent low-grade CHF. Chronic elevation of the right
hemidiaphragm. No alveolar pneumonia.

Thoracic aortic atherosclerosis.

## 2020-03-31 IMAGING — DX DG CHEST 1V PORT
1 series · 1 of 1 positions shown · non-contrast
Comparison: Chest radiograph performed 07/08/2017

CLINICAL DATA: Acute onset of shortness of breath.

EXAM:
PORTABLE CHEST 1 VIEW

[chest ap]
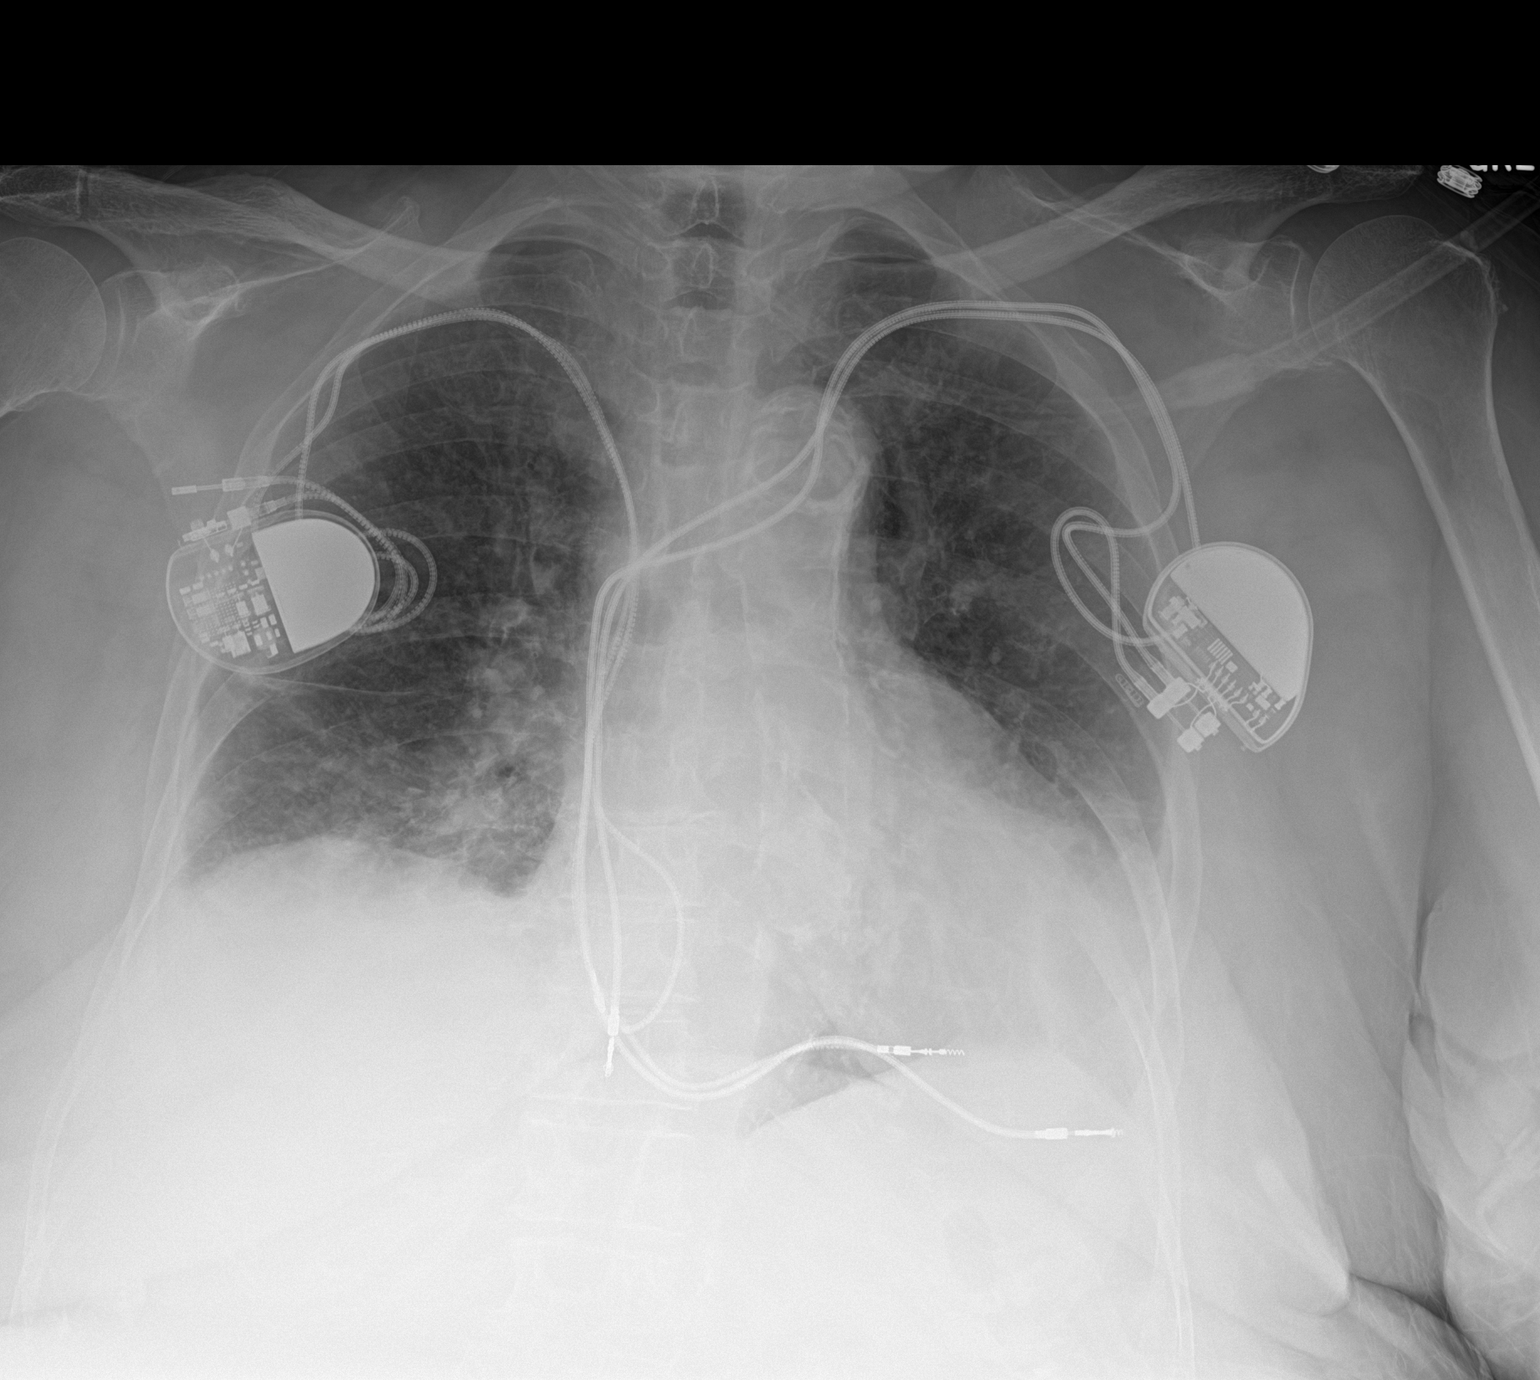

[1 of 1 positions shown; findings below may reference images not displayed]

FINDINGS: There is mild elevation of the right hemidiaphragm. Bibasilar
airspace opacities may reflect pulmonary edema or possibly
pneumonia. Vascular congestion is noted. Peribronchial thickening is
seen. A small right pleural effusion is noted. No pneumothorax is
seen.

The cardiomediastinal silhouette is borderline enlarged. Bilateral
pacemakers are noted, with leads ending overlying the right atrium
and right ventricle. No acute osseous abnormalities are seen.
IMPRESSION: 1. Mild elevation of the right hemidiaphragm. Bibasilar airspace
opacities may reflect pulmonary edema or possibly pneumonia.
2. Vascular congestion and borderline cardiomegaly.
3. Small right pleural effusion noted. Peribronchial thickening
seen.
